# Patient Record
Sex: Male | Born: 1950 | Race: White | Hispanic: No | Marital: Married | Smoking: Never smoker
Health system: Southern US, Community
[De-identification: ages and names within clinical notes are randomized; demographics above are authoritative.]

## PROBLEM LIST (undated history)

## (undated) DIAGNOSIS — R1900 Intra-abdominal and pelvic swelling, mass and lump, unspecified site: Secondary | ICD-10-CM

## (undated) DIAGNOSIS — C61 Malignant neoplasm of prostate: Secondary | ICD-10-CM

## (undated) DIAGNOSIS — T4145XA Adverse effect of unspecified anesthetic, initial encounter: Secondary | ICD-10-CM

## (undated) DIAGNOSIS — T8859XA Other complications of anesthesia, initial encounter: Secondary | ICD-10-CM

## (undated) DIAGNOSIS — R3129 Other microscopic hematuria: Secondary | ICD-10-CM

## (undated) DIAGNOSIS — C4491 Basal cell carcinoma of skin, unspecified: Secondary | ICD-10-CM

## (undated) DIAGNOSIS — IMO0002 Reserved for concepts with insufficient information to code with codable children: Secondary | ICD-10-CM

## (undated) DIAGNOSIS — J309 Allergic rhinitis, unspecified: Secondary | ICD-10-CM

## (undated) HISTORY — DX: Other microscopic hematuria: R31.29

## (undated) HISTORY — PX: TONSILLECTOMY: SHX5217

## (undated) HISTORY — DX: Allergic rhinitis, unspecified: J30.9

## (undated) HISTORY — DX: Reserved for concepts with insufficient information to code with codable children: IMO0002

## (undated) HISTORY — PX: PROSTATE BIOPSY: SHX241

## (undated) HISTORY — DX: Intra-abdominal and pelvic swelling, mass and lump, unspecified site: R19.00

## (undated) HISTORY — PX: TONSILLECTOMY: SUR1361

## (undated) HISTORY — PX: KNEE ARTHROSCOPY W/ ACL RECONSTRUCTION: SHX1858

---

## 2007-12-17 ENCOUNTER — Encounter: Payer: Self-pay | Admitting: Family Medicine

## 2008-05-09 ENCOUNTER — Ambulatory Visit: Payer: Self-pay | Admitting: Family Medicine

## 2008-05-09 DIAGNOSIS — R1310 Dysphagia, unspecified: Secondary | ICD-10-CM | POA: Insufficient documentation

## 2008-05-09 DIAGNOSIS — J309 Allergic rhinitis, unspecified: Secondary | ICD-10-CM

## 2008-05-09 DIAGNOSIS — R3129 Other microscopic hematuria: Secondary | ICD-10-CM

## 2008-05-09 HISTORY — DX: Other microscopic hematuria: R31.29

## 2008-05-09 HISTORY — DX: Allergic rhinitis, unspecified: J30.9

## 2008-05-09 LAB — CONVERTED CEMR LAB
Bilirubin Urine: NEGATIVE
Blood in Urine, dipstick: NEGATIVE
Glucose, Urine, Semiquant: NEGATIVE
Ketones, urine, test strip: NEGATIVE
Specific Gravity, Urine: <1.005
Urobilinogen, UA: 0.2

## 2008-06-08 ENCOUNTER — Encounter: Payer: Self-pay | Admitting: Family Medicine

## 2008-07-21 ENCOUNTER — Ambulatory Visit: Payer: Self-pay | Admitting: Family Medicine

## 2008-07-21 DIAGNOSIS — R1900 Intra-abdominal and pelvic swelling, mass and lump, unspecified site: Secondary | ICD-10-CM

## 2008-07-21 HISTORY — DX: Intra-abdominal and pelvic swelling, mass and lump, unspecified site: R19.00

## 2008-09-21 ENCOUNTER — Encounter: Payer: Self-pay | Admitting: Family Medicine

## 2010-08-16 ENCOUNTER — Other Ambulatory Visit (INDEPENDENT_AMBULATORY_CARE_PROVIDER_SITE_OTHER): Payer: BC Managed Care – PPO

## 2010-08-16 DIAGNOSIS — Z0000 Encounter for general adult medical examination without abnormal findings: Secondary | ICD-10-CM

## 2010-08-16 LAB — LIPID PANEL
Total CHOL/HDL Ratio: 3
VLDL: 11 mg/dL (ref 0.0–40.0)

## 2010-08-16 LAB — CBC WITH DIFFERENTIAL/PLATELET
Basophils Relative: 0.7 % (ref 0.0–3.0)
Eosinophils Relative: 1.9 % (ref 0.0–5.0)
HCT: 39.4 % (ref 39.0–52.0)
Hemoglobin: 13.3 g/dL (ref 13.0–17.0)
MCV: 97.1 fl (ref 78.0–100.0)
Monocytes Absolute: 0.4 10*3/uL (ref 0.1–1.0)
Neutro Abs: 1.8 10*3/uL (ref 1.4–7.7)
Neutrophils Relative %: 45.3 % (ref 43.0–77.0)
RBC: 4.05 Mil/uL — ABNORMAL LOW (ref 4.22–5.81)
WBC: 4.1 10*3/uL — ABNORMAL LOW (ref 4.5–10.5)

## 2010-08-16 LAB — HEPATIC FUNCTION PANEL
Alkaline Phosphatase: 49 U/L (ref 39–117)
Bilirubin, Direct: 0.3 mg/dL (ref 0.0–0.3)
Total Protein: 6.4 g/dL (ref 6.0–8.3)

## 2010-08-16 LAB — POCT URINALYSIS DIPSTICK
Bilirubin, UA: NEGATIVE
Leukocytes, UA: NEGATIVE
Nitrite, UA: NEGATIVE
Urobilinogen, UA: 0.2
pH, UA: 8.5

## 2010-08-16 LAB — BASIC METABOLIC PANEL
BUN: 24 mg/dL — ABNORMAL HIGH (ref 6–23)
GFR: 85.06 mL/min (ref >60.00)
Glucose, Bld: 97 mg/dL (ref 70–99)
Potassium: 4.3 mEq/L (ref 3.5–5.1)

## 2010-09-03 ENCOUNTER — Encounter: Payer: Self-pay | Admitting: Family Medicine

## 2010-09-04 ENCOUNTER — Ambulatory Visit (INDEPENDENT_AMBULATORY_CARE_PROVIDER_SITE_OTHER): Payer: BC Managed Care – PPO | Admitting: Family Medicine

## 2010-09-04 ENCOUNTER — Encounter: Payer: Self-pay | Admitting: Family Medicine

## 2010-09-04 VITALS — BP 110/72 | HR 60 | Temp 98.0°F | Resp 12 | Ht 70.0 in | Wt 142.0 lb

## 2010-09-04 DIAGNOSIS — Z0000 Encounter for general adult medical examination without abnormal findings: Secondary | ICD-10-CM

## 2010-09-04 DIAGNOSIS — R0989 Other specified symptoms and signs involving the circulatory and respiratory systems: Secondary | ICD-10-CM

## 2010-09-04 MED ORDER — TETANUS-DIPHTH-ACELL PERTUSSIS 5-2.5-18.5 LF-MCG/0.5 IM SUSP: 0.5000 mL | Freq: Once | INTRAMUSCULAR | Status: DC

## 2010-09-04 NOTE — Progress Notes (Signed)
  Subjective:    Patient ID: Chris Stone, male    DOB: Jun 07, 1950, 60 y.o.   MRN: [id number]  HPI Here for complete physical exam. Generally very healthy. Exercises regularly. Recent examination aviation exam and carotid bruits noted. He is nonsmoker. No history of CAD or peripheral vascular disease. Note no history of diabetes. Cycles several times per week. No history of hyperlipidemia. No family history of CAD. Feels well. No neurologic symptoms. No history of TIA.  Colonoscopy 2009. Last tetanus unknown.  Past Medical History  Diagnosis Date  . ABDOMINAL MASS 07/21/2008  . ALLERGIC RHINITIS 05/09/2008  . DYSPHAGIA UNSPECIFIED 05/09/2008  . Microscopic hematuria 05/09/2008  . Squamous cell carcinoma     nose   Past Surgical History  Procedure Date  . Tonsillectomy   . Knee arthroscopy w/ acl reconstruction     reports that he has never smoked. He does not have any smokeless tobacco history on file. He reports that he does not drink alcohol or use illicit drugs. family history includes Cancer (age of onset:75) in his father and Hypertension in his father.  There is no history of Arthritis. Allergies  Allergen Reactions  . Penicillins     As a child, rash      Review of Systems  Constitutional: Negative for fever, activity change, appetite change and fatigue.  HENT: Negative for ear pain, congestion and trouble swallowing.   Eyes: Negative for pain and visual disturbance.  Respiratory: Negative for cough, shortness of breath and wheezing.   Cardiovascular: Negative for chest pain and palpitations.  Gastrointestinal: Negative for nausea, vomiting, abdominal pain, diarrhea, constipation, blood in stool, abdominal distention and rectal pain.  Genitourinary: Negative for dysuria, hematuria and testicular pain.  Musculoskeletal: Negative for joint swelling and arthralgias.  Skin: Negative for rash.  Neurological: Negative for dizziness, syncope and headaches.  Hematological:  Negative for adenopathy.  Psychiatric/Behavioral: Negative for confusion and dysphoric mood.       Objective:   Physical Exam  Constitutional: He is oriented to person, place, and time. He appears well-developed and well-nourished. No distress.  HENT:  Head: Normocephalic and atraumatic.  Right Ear: External ear normal.  Left Ear: External ear normal.  Mouth/Throat: Oropharynx is clear and moist.  Eyes: Conjunctivae and EOM are normal. Pupils are equal, round, and reactive to light.  Neck: Normal range of motion. Neck supple. No thyromegaly present.  Cardiovascular: Normal rate, regular rhythm and normal heart sounds.   No murmur heard. Pulmonary/Chest: No respiratory distress. He has no wheezes. He has no rales.  Abdominal: Soft. Bowel sounds are normal. He exhibits no distension and no mass. There is no tenderness. There is no rebound and no guarding.  Genitourinary: Rectum normal and prostate normal.  Musculoskeletal: He exhibits no edema.  Lymphadenopathy:    He has no cervical adenopathy.  Neurological: He is alert and oriented to person, place, and time. He displays normal reflexes. No cranial nerve deficit.  Skin: No rash noted.  Psychiatric: He has a normal mood and affect.          Assessment & Plan:  Healthy 60 year old male. Tetanus booster. Labs reviewed with patient -all favorable. He has question of carotid bruit right and possibly faint on the left. Question if some of this is related to hyperdynamic heart beat. No heart murmur. Go ahead with carotid Dopplers

## 2010-09-13 ENCOUNTER — Encounter (INDEPENDENT_AMBULATORY_CARE_PROVIDER_SITE_OTHER): Payer: BC Managed Care – PPO | Admitting: Cardiology

## 2010-09-13 DIAGNOSIS — R0989 Other specified symptoms and signs involving the circulatory and respiratory systems: Secondary | ICD-10-CM

## 2010-09-19 NOTE — Progress Notes (Signed)
Quick Note:  Pt informed on cell VM ______ 

## 2010-10-03 ENCOUNTER — Ambulatory Visit: Payer: BC Managed Care – PPO | Admitting: Family Medicine

## 2012-12-07 ENCOUNTER — Encounter: Payer: Self-pay | Admitting: Family Medicine

## 2012-12-07 ENCOUNTER — Ambulatory Visit (INDEPENDENT_AMBULATORY_CARE_PROVIDER_SITE_OTHER): Payer: BC Managed Care – PPO | Admitting: Family Medicine

## 2012-12-07 VITALS — BP 110/64 | HR 84 | Temp 98.2°F | Wt 149.0 lb

## 2012-12-07 DIAGNOSIS — R350 Frequency of micturition: Secondary | ICD-10-CM

## 2012-12-07 DIAGNOSIS — R509 Fever, unspecified: Secondary | ICD-10-CM

## 2012-12-07 LAB — POCT URINALYSIS DIPSTICK
Ketones, UA: NEGATIVE
Protein, UA: NEGATIVE
Spec Grav, UA: 1.015
pH, UA: 6.5

## 2012-12-07 MED ORDER — CIPROFLOXACIN HCL 500 MG PO TABS: 500.0000 mg | ORAL_TABLET | Freq: Two times a day (BID) | ORAL | Status: DC

## 2012-12-07 NOTE — Progress Notes (Signed)
Pre visit review using our clinic review tool, if applicable. No additional management support is needed unless otherwise documented below in the visit note. 

## 2012-12-07 NOTE — Progress Notes (Signed)
  Subjective:    Patient ID: Chris Stone, male    DOB: 1951-01-09, 62 y.o.   MRN: [id number]  HPI Patient seen with chief complaint of fever He was with work in [place] last Thursday and noticed some chills but no documented fever. He denied any other symptoms at that time. He returned back here Friday and developed fever 102 along with some chills and body aches. He denied any nasal congestion, cough, sore throat. Only minimal headaches. He has follicular type rash on his back which has had many times in the past. He complained of some mild urinary frequency and urgency but no burning with urination. Denied any abdominal pain. Patient's been taking alternate aspirin Motrin which seems to bring his fever down. Fever 101 earlier today but none currently. Overall feels fairly well  He is not having any significant arthralgias. No recent appetite or weight changes.  Past Medical History  Diagnosis Date  . ABDOMINAL MASS 07/21/2008  . ALLERGIC RHINITIS 05/09/2008  . DYSPHAGIA UNSPECIFIED 05/09/2008  . Microscopic hematuria 05/09/2008  . Squamous cell carcinoma     nose   Past Surgical History  Procedure Laterality Date  . Tonsillectomy    . Knee arthroscopy w/ acl reconstruction      reports that he has never smoked. He does not have any smokeless tobacco history on file. He reports that he does not drink alcohol or use illicit drugs. family history includes Cancer (age of onset: 18) in his father; Hypertension in his father. There is no history of Arthritis. Allergies  Allergen Reactions  . Penicillins     As a child, rash      Review of Systems  Constitutional: Positive for fever and chills. Negative for appetite change and unexpected weight change.  HENT: Negative for ear pain, sore throat, trouble swallowing and voice change.   Respiratory: Negative for cough and shortness of breath.   Cardiovascular: Negative for chest pain.  Gastrointestinal: Negative for abdominal pain.   Genitourinary: Positive for urgency. Negative for hematuria.  Skin: Positive for rash.  Neurological: Positive for headaches (Mild and intermittent). Negative for dizziness.  Hematological: Negative for adenopathy.       Objective:   Physical Exam  Constitutional: He appears well-developed and well-nourished. No distress.  HENT:  Right Ear: External ear normal.  Left Ear: External ear normal.  Mouth/Throat: Oropharynx is clear and moist.  Neck: Neck supple.  Cardiovascular: Normal rate.   Pulmonary/Chest: Effort normal and breath sounds normal. No respiratory distress. He has no wheezes. He has no rales.  Abdominal: Soft. He exhibits no mass. There is no tenderness. There is no rebound and no guarding.  Genitourinary: Rectum normal and prostate normal.  Musculoskeletal: He exhibits no edema.  Lymphadenopathy:    He has no cervical adenopathy.  Neurological: He is alert.  Skin: Rash noted.  Patient has follicular rash scattered on his back right and left side. No petechiae. No pustules.          Assessment & Plan:  Fever. Patient looks well and has nonfocal exam. His urine dipstick is equivocal and relatively low suspicion for UTI with trace blood and trace leukocytes. Urine culture sent. Start Cipro 500 mg twice a day pending culture results. Check CBC and sedimentation rate. He does not have any respiratory symptoms. He has nonspecific follicular rash which doubt is related to his fever and he had this multiple times in the past without fever

## 2012-12-08 LAB — CBC WITH DIFFERENTIAL/PLATELET
Basophils Absolute: 0 10*3/uL (ref 0.0–0.1)
Eosinophils Absolute: 0 10*3/uL (ref 0.0–0.7)
HCT: 36.2 % — ABNORMAL LOW (ref 39.0–52.0)
Lymphocytes Relative: 18.5 % (ref 12.0–46.0)
MCHC: 35.2 g/dL (ref 30.0–36.0)
Monocytes Absolute: 0.5 10*3/uL (ref 0.1–1.0)
Monocytes Relative: 11.9 % (ref 3.0–12.0)
Neutrophils Relative %: 68.3 % (ref 43.0–77.0)
Platelets: 266 10*3/uL (ref 150.0–400.0)
RDW: 12.4 % (ref 11.5–14.6)

## 2012-12-08 LAB — SEDIMENTATION RATE: Sed Rate: 60 mm/hr — ABNORMAL HIGH (ref 0–22)

## 2012-12-09 ENCOUNTER — Telehealth: Payer: Self-pay | Admitting: Family Medicine

## 2012-12-09 LAB — URINE CULTURE

## 2012-12-09 NOTE — Telephone Encounter (Signed)
Pt states dr Caryl Never told him to report back if his fever came back and it was 99 last night. W/ headache at night. Goes away during the day. Pt would like to speak w/ dr burchette to rule out lime disease at they discussed.

## 2012-12-09 NOTE — Telephone Encounter (Signed)
Pt would like it if you would give him a call back.

## 2012-12-14 ENCOUNTER — Ambulatory Visit (INDEPENDENT_AMBULATORY_CARE_PROVIDER_SITE_OTHER): Payer: BC Managed Care – PPO | Admitting: Family Medicine

## 2012-12-14 ENCOUNTER — Encounter: Payer: Self-pay | Admitting: Family Medicine

## 2012-12-14 ENCOUNTER — Telehealth: Payer: Self-pay | Admitting: Family Medicine

## 2012-12-14 VITALS — BP 110/68 | HR 82 | Temp 98.0°F | Wt 145.0 lb

## 2012-12-14 DIAGNOSIS — N390 Urinary tract infection, site not specified: Secondary | ICD-10-CM

## 2012-12-14 DIAGNOSIS — M2550 Pain in unspecified joint: Secondary | ICD-10-CM

## 2012-12-14 DIAGNOSIS — D649 Anemia, unspecified: Secondary | ICD-10-CM

## 2012-12-14 DIAGNOSIS — T148 Other injury of unspecified body region: Secondary | ICD-10-CM

## 2012-12-14 DIAGNOSIS — W57XXXA Bitten or stung by nonvenomous insect and other nonvenomous arthropods, initial encounter: Secondary | ICD-10-CM

## 2012-12-14 LAB — POCT URINALYSIS DIPSTICK
Leukocytes, UA: NEGATIVE
Nitrite, UA: NEGATIVE
Protein, UA: NEGATIVE
Spec Grav, UA: >=1.03
Urobilinogen, UA: 0.2
pH, UA: 5.5

## 2012-12-14 NOTE — Progress Notes (Signed)
   Subjective:    Patient ID: Donald Prose, male    DOB: 11/16/1950, 62 y.o.   MRN: [id number]  HPI Patient presents for followup regarding recent febrile illness. His symptoms and presentation were fairly nonspecific. He complained of some urine frequency but no burning. Urine dipstick revealed trace leukocytes and trace blood. His urine culture did grow out greater than 100,000 colonies Escherichia coli sensitive to Cipro which he started on. He has not had any documented fevers since then.  He had nonspecific elevation of sedimentation rate of 60 and CBC with mild normocytic anemia with hemoglobin 12.7. Patient relates previous colonoscopy about 3 years ago.  Overall, feels fairly well at this time but has had some complaints of intermittent arthralgias of the past couple of months. He relates back in August pulled off couple of deer ticks when he was up in [place]. He never had any rash. No fever until recently.  Past Medical History  Diagnosis Date  . ABDOMINAL MASS 07/21/2008  . ALLERGIC RHINITIS 05/09/2008  . DYSPHAGIA UNSPECIFIED 05/09/2008  . Microscopic hematuria 05/09/2008  . Squamous cell carcinoma     nose   Past Surgical History  Procedure Laterality Date  . Tonsillectomy    . Knee arthroscopy w/ acl reconstruction      reports that he has never smoked. He does not have any smokeless tobacco history on file. He reports that he does not drink alcohol or use illicit drugs. family history includes Cancer (age of onset: 71) in his father; Hypertension in his father. There is no history of Arthritis. Allergies  Allergen Reactions  . Penicillins     As a child, rash      Review of Systems  Constitutional: Negative for fever, chills and unexpected weight change.  Respiratory: Negative for cough and shortness of breath.   Cardiovascular: Negative for chest pain.  Gastrointestinal: Negative for abdominal pain.  Musculoskeletal: Positive for arthralgias. Negative  for myalgias.  Skin: Negative for rash.  Neurological: Negative for dizziness and headaches.  Hematological: Negative for adenopathy.       Objective:   Physical Exam  Constitutional: He appears well-developed and well-nourished.  HENT:  Mouth/Throat: Oropharynx is clear and moist.  Neck: Neck supple.  Cardiovascular: Normal rate and regular rhythm.   Pulmonary/Chest: Effort normal and breath sounds normal. No respiratory distress. He has no wheezes. He has no rales.  Musculoskeletal: He exhibits no edema.  Lymphadenopathy:    He has no cervical adenopathy.  Skin: No rash noted.          Assessment & Plan:  #1 recent fever.  We initially entertained idea that his fever might not have been infectious related since he did not have any obvious symptoms. However, with positive urine culture that likely explains his recent fever and his fever did seem to have resolve since treatment for that.  Repeat urinalysis today #2 recent normocytic anemia. Very nonspecific. Recent colonoscopy a few years ago normal. Check further labs with TIBC, serum ferritin, serum iron, B12, repeat CBC. #3 history of deer tick bites with intermittent relatively mild arthralgias. Check Lyme antibodies, though he does not give any history of likely erythema chronicum migrans rash. #4 arthralgias. Doubt PMR but he does have some mild stiffness- but symptoms tend to be more intermittent.

## 2012-12-14 NOTE — Progress Notes (Signed)
Pre visit review using our clinic review tool, if applicable. No additional management support is needed unless otherwise documented below in the visit note. 

## 2012-12-14 NOTE — Telephone Encounter (Signed)
Patient Information:  Caller Name: Yishai  Phone: [phone]  Patient: Chris Stone, Chris Stone  Gender: Male  DOB: 05/16/1950  Age: 62 Years  PCP: Evelena Peat [facility])  Office Follow Up:  Does the office need to follow up with this patient?: No  Instructions For The Office: N/A  RN Note:  On day 6 of Cipro for UTI.  Concerned about elevated temperature, 99.0 12/13/12 PM. Reports chronic low back pain for months. Mild left flank pain present. Dr Caryl Never has no appointments within 4 hours. Patient declined to see another provider sooner.  Requested appointment at 16:00  Symptoms  Reason For Call & Symptoms: Concerned about "fever" (temp elevation) of 99.0 po 12/13/12 at 2000.   On day #6 of 7 day course of Cipro for UTI.  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  Reviewed Health History In EMR: Yes  Reviewed Medications In EMR: Yes  Reviewed Allergies In EMR: Yes  Reviewed Surgeries / Procedures: Yes  Date of Onset of Symptoms: 12/04/2012  Treatments Tried: Cipro since 12/09/12  Treatments Tried Worked: No  Guideline(s) Used:  Urination Pain - Male  Disposition Per Guideline:   Go to Office Now  Reason For Disposition Reached:   Side (flank) or lower back pain present  Advice Given:  Fluids  : Drink extra fluids (Reason: to produce a dilute, nonirritating urine).  Call Back If:   Fever lasts more than 24 hours on antibiotics  You become worse.  Patient Will Follow Care Advice:  YES  Appointment Scheduled:  12/14/2012 16:00:00 Appointment Scheduled Provider:  Evelena Peat [facility])

## 2012-12-15 LAB — IRON AND TIBC
%SAT: 15 % — ABNORMAL LOW (ref 20–55)
Iron: 49 ug/dL (ref 42–165)
UIBC: 269 ug/dL (ref 125–400)

## 2012-12-15 LAB — CBC WITH DIFFERENTIAL/PLATELET
Basophils Relative: 0.4 % (ref 0.0–3.0)
Eosinophils Absolute: 0 10*3/uL (ref 0.0–0.7)
Eosinophils Relative: 0.7 % (ref 0.0–5.0)
Hemoglobin: 13.5 g/dL (ref 13.0–17.0)
Lymphocytes Relative: 24.6 % (ref 12.0–46.0)
MCV: 93.8 fl (ref 78.0–100.0)
Monocytes Absolute: 0.4 10*3/uL (ref 0.1–1.0)
Neutrophils Relative %: 66.8 % (ref 43.0–77.0)
Platelets: 416 10*3/uL — ABNORMAL HIGH (ref 150.0–400.0)
RBC: 4.18 Mil/uL — ABNORMAL LOW (ref 4.22–5.81)
RDW: 12.2 % (ref 11.5–14.6)
WBC: 5.8 10*3/uL (ref 4.5–10.5)

## 2012-12-15 LAB — B. BURGDORFI ANTIBODIES: B burgdorferi Ab IgG+IgM: 0.3 {ISR}

## 2012-12-15 LAB — SEDIMENTATION RATE: Sed Rate: 17 mm/hr (ref 0–22)

## 2012-12-21 ENCOUNTER — Ambulatory Visit: Payer: BC Managed Care – PPO | Admitting: Family Medicine

## 2013-02-17 ENCOUNTER — Telehealth: Payer: Self-pay | Admitting: Family Medicine

## 2013-02-17 NOTE — Telephone Encounter (Signed)
Patient Information:  Caller Name: Truett  Phone: [phone]  Patient: Chris Stone, Chris Stone  Gender: Male  DOB: Sep 23, 1950  Age: 63 Years  PCP: Carolann Littler (Family Practice)  Office Follow Up:  Does the office need to follow up with this patient?: No  Instructions For The Office: N/A   Symptoms  Reason For Call & Symptoms: Pt has had a couple of episodes of trace blood on toilet paper.  Pt does have hx of hemorrhoids.   02/17/13 small amt of blood on toilet paper, stool doesn't appear red/black/tarry, pt hasn't had any blood in underwear so as far as he knows the bleeding is only occuring with bowel movements.  No abd/rectal pain.  Afebrile.  Reviewed Health History In EMR: Yes  Reviewed Medications In EMR: Yes  Reviewed Allergies In EMR: Yes  Reviewed Surgeries / Procedures: Yes  Date of Onset of Symptoms: 01/14/2013  Guideline(s) Used:  Rectal Bleeding  Disposition Per Guideline:   See Within 2 Weeks in Office  Reason For Disposition Reached:   Rectal bleeding is minimal (e.g., blood just on toilet paper, a few drops in toilet bowl)  Advice Given:  Warm SITZ Baths Twice a Day:   Sit in a warm saline bath for 20 minutes 2 times daily to cleanse the rectal area and to promote healing.  You can add 2 ounces (57 grams) of table salt or baking soda to each tub of water.  Call Back If:  Bleeding increases in amount  You become worse.  Patient Will Follow Care Advice:  YES  Appointment Scheduled:  02/18/2013 09:30:00 Appointment Scheduled Provider:  Carolann Littler [facility])

## 2013-02-17 NOTE — Telephone Encounter (Signed)
Patient has an appt with BB on 02/18/13.

## 2013-02-18 ENCOUNTER — Ambulatory Visit (INDEPENDENT_AMBULATORY_CARE_PROVIDER_SITE_OTHER): Payer: BC Managed Care – PPO | Admitting: Family Medicine

## 2013-02-18 ENCOUNTER — Other Ambulatory Visit: Payer: Self-pay

## 2013-02-18 ENCOUNTER — Encounter: Payer: Self-pay | Admitting: Family Medicine

## 2013-02-18 VITALS — BP 108/68 | HR 112 | Temp 98.3°F | Wt 148.0 lb

## 2013-02-18 DIAGNOSIS — K921 Melena: Secondary | ICD-10-CM

## 2013-02-18 MED ORDER — HYDROCORTISONE ACETATE 25 MG RE SUPP: 25.0000 mg | Freq: Two times a day (BID) | RECTAL | Status: DC

## 2013-02-18 NOTE — Progress Notes (Signed)
Pre visit review using our clinic review tool, if applicable. No additional management support is needed unless otherwise documented below in the visit note. 

## 2013-02-18 NOTE — Progress Notes (Signed)
   Subjective:    Patient ID: Chris Stone, male    DOB: [birth year], 63 y.o.   MRN: [id number]  Rectal Bleeding  Pertinent negatives include no fever, no abdominal pain, no diarrhea, no nausea and no vomiting.   3-4 recent episodes of bright red blood per rectum with wiping. Symptoms are often. Last colonoscopy estimated 2000 and though we do not have records. He's not any change of bowel habits. No constipation. No diarrhea. No appetite or weight changes. No associated pain with bowel movements. Bright blood this occurred only with wiping. Past history of external hemorrhoids. Denies any abdominal pain Recent hgb normal.  Past Medical History  Diagnosis Date  . ABDOMINAL MASS 07/21/2008  . ALLERGIC RHINITIS 05/09/2008  . DYSPHAGIA UNSPECIFIED 05/09/2008  . Microscopic hematuria 05/09/2008  . Squamous cell carcinoma     nose   Past Surgical History  Procedure Laterality Date  . Tonsillectomy    . Knee arthroscopy w/ acl reconstruction      reports that he has never smoked. He does not have any smokeless tobacco history on file. He reports that he does not drink alcohol or use illicit drugs. family history includes Cancer (age of onset: 72) in his father; Hypertension in his father. There is no history of Arthritis. Allergies  Allergen Reactions  . Penicillins     As a child, rash      Review of Systems  Constitutional: Negative for fever, chills, appetite change and unexpected weight change.  Respiratory: Negative for shortness of breath.   Gastrointestinal: Positive for hematochezia and anal bleeding. Negative for nausea, vomiting, abdominal pain, diarrhea, constipation and abdominal distention.  Hematological: Negative for adenopathy.       Objective:   Physical Exam  Constitutional: He appears well-developed and well-nourished.  Cardiovascular: Normal rate.   Pulmonary/Chest: Effort normal and breath sounds normal. No respiratory distress. He has no wheezes. He has no  rales.  Genitourinary:  He has some external skin tags. No anal fissure. He has some internal hemorrhoids which are trying to prolapse out slightly. Digital exam reveals no masses. Hemoccult negative stool          Assessment & Plan:  Hemorrhoids. We discussed measures to reduce constipation. Warm sitz baths. Hydrocortisone 25 mg progress twice daily

## 2013-02-18 NOTE — Patient Instructions (Signed)
Hemorrhoids Hemorrhoids are swollen veins around the rectum or anus. There are two types of hemorrhoids:   Internal hemorrhoids. These occur in the veins just inside the rectum. They may poke through to the outside and become irritated and painful.  External hemorrhoids. These occur in the veins outside the anus and can be felt as a painful swelling or hard lump near the anus. CAUSES  Pregnancy.   Obesity.   Constipation or diarrhea.   Straining to have a bowel movement.   Sitting for long periods on the toilet.  Heavy lifting or other activity that caused you to strain.  Anal intercourse. SYMPTOMS   Pain.   Anal itching or irritation.   Rectal bleeding.   Fecal leakage.   Anal swelling.   One or more lumps around the anus.  DIAGNOSIS  Your caregiver may be able to diagnose hemorrhoids by visual examination. Other examinations or tests that may be performed include:   Examination of the rectal area with a gloved hand (digital rectal exam).   Examination of anal canal using a small tube (scope).   A blood test if you have lost a significant amount of blood.  A test to look inside the colon (sigmoidoscopy or colonoscopy). TREATMENT Most hemorrhoids can be treated at home. However, if symptoms do not seem to be getting better or if you have a lot of rectal bleeding, your caregiver may perform a procedure to help make the hemorrhoids get smaller or remove them completely. Possible treatments include:   Placing a rubber band at the base of the hemorrhoid to cut off the circulation (rubber band ligation).   Injecting a chemical to shrink the hemorrhoid (sclerotherapy).   Using a tool to burn the hemorrhoid (infrared light therapy).   Surgically removing the hemorrhoid (hemorrhoidectomy).   Stapling the hemorrhoid to block blood flow to the tissue (hemorrhoid stapling).  HOME CARE INSTRUCTIONS   Eat foods with fiber, such as whole grains, beans,  nuts, fruits, and vegetables. Ask your doctor about taking products with added fiber in them (fibersupplements).  Increase fluid intake. Drink enough water and fluids to keep your urine clear or pale yellow.   Exercise regularly.   Go to the bathroom when you have the urge to have a bowel movement. Do not wait.   Avoid straining to have bowel movements.   Keep the anal area dry and clean. Use wet toilet paper or moist towelettes after a bowel movement.   Medicated creams and suppositories may be used or applied as directed.   Only take over-the-counter or prescription medicines as directed by your caregiver.   Take warm sitz baths for 15 20 minutes, 3 4 times a day to ease pain and discomfort.   Place ice packs on the hemorrhoids if they are tender and swollen. Using ice packs between sitz baths may be helpful.   Put ice in a plastic bag.   Place a towel between your skin and the bag.   Leave the ice on for 15 20 minutes, 3 4 times a day.   Do not use a donut-shaped pillow or sit on the toilet for long periods. This increases blood pooling and pain.  SEEK MEDICAL CARE IF:  You have increasing pain and swelling that is not controlled by treatment or medicine.  You have uncontrolled bleeding.  You have difficulty or you are unable to have a bowel movement.  You have pain or inflammation outside the area of the hemorrhoids. MAKE SURE YOU:    Understand these instructions.  Will watch your condition.  Will get help right away if you are not doing well or get worse. Document Released: 12/29/1999 Document Revised: 12/18/2011 Document Reviewed: 11/05/2011 Exit[employer] Patient Information 2014 [employer].  

## 2013-03-03 ENCOUNTER — Telehealth: Payer: Self-pay | Admitting: Family Medicine

## 2013-03-03 NOTE — Telephone Encounter (Addendum)
Pt states he has exhausted his short term sick leave and it will go into long term.  Pt has a form needs to be filled out and request to email (sending to [place])  From [email or web address] From an illness time period 11/21-12/10

## 2013-03-05 NOTE — Telephone Encounter (Signed)
Form in your folder

## 2013-03-08 NOTE — Telephone Encounter (Signed)
Pt informed

## 2013-03-08 NOTE — Telephone Encounter (Signed)
Let him know I will have completed before I leave today.

## 2013-05-25 ENCOUNTER — Telehealth: Payer: Self-pay | Admitting: Family Medicine

## 2013-05-25 NOTE — Telephone Encounter (Signed)
Patient Information:  Caller Name: [name]  Phone: [phone]  Patient: Chris Stone, Chris Stone  Gender: Male  DOB: 1950-03-08  Age: 63 Years  PCP: Carolann Littler [facility])  Office Follow Up:  Does the office need to follow up with this patient?: No  Instructions For The Office: N/A  RN Note:  Home care advice and call back parameters reviewed. Appt scheduled.  Symptoms  Reason For Call & Symptoms: Patient states he has had nagging back since Fall 2014.  He reports this occurred after lifing a battery from a boat/building house and lifting.  Located at belt line. Left side /Left flank.  Constant worse with laying in bed or first getting up, worse with bending over to get shoes.  No urinary issues. Afebrile  Reviewed Health History In EMR: Yes  Reviewed Medications In EMR: Yes  Reviewed Allergies In EMR: Yes  Reviewed Surgeries / Procedures: Yes  Date of Onset of Symptoms: 11/14/2012  Treatments Tried: Advil  Treatments Tried Worked: Yes  Guideline(s) Used:  Flank Pain  Disposition Per Guideline:   See Within 3 Days in Office  Reason For Disposition Reached:   Mild pain (i.e., scale 1-3; does not interfere with normal activities) and present > 3 days  Advice Given:  Reassurance:  Mild flank and back pain can result from excessive twisting, heavy lifting, or from an un-noticed minor back injury.  Most times, the pain gets better in a couple days.  Here is some care advice that should help.  Cold or Heat:  Heat Pack: If pain lasts over 2 days, apply heat to the sore area. Use a heat pack, heating pad, or warm wet washcloth. Do this for 10 minutes, then as needed.  Pain Medicines:  For pain relief, you can take either acetaminophen, ibuprofen, or naproxen.  Acetaminophen (e.g., Tylenol):  Regular Strength Tylenol: Take 650 mg (two 325 mg pills) by mouth every 4-6 hours as needed. Each Regular Strength Tylenol pill has 325 mg of acetaminophen.  Ibuprofen (e.g., Motrin,  Advil):  Take 400 mg (two 200 mg pills) by mouth every 6 hours.  Another choice is to take 600 mg (three 200 mg pills) by mouth every 8 hours.  The most you should take each day is 1,200 mg (six 200 mg pills), unless your doctor has told you to take more.  Call Back If:  Fever over 100.5 F (38.1 C)  Burning with urination or blood in urine  You become worse.  Patient Will Follow Care Advice:  YES  Appointment Scheduled:  05/26/2013 14:00:00 Appointment Scheduled Provider:  Alysia Penna [facility])

## 2013-05-26 ENCOUNTER — Ambulatory Visit
Admission: RE | Admit: 2013-05-26 | Discharge: 2013-05-26 | Disposition: A | Discharge status: Home / Self Care | Payer: BC Managed Care – PPO | Source: Ambulatory Visit | Attending: Family Medicine | Admitting: Family Medicine

## 2013-05-26 ENCOUNTER — Encounter: Payer: Self-pay | Admitting: Family Medicine

## 2013-05-26 ENCOUNTER — Ambulatory Visit (INDEPENDENT_AMBULATORY_CARE_PROVIDER_SITE_OTHER): Payer: BC Managed Care – PPO | Admitting: Family Medicine

## 2013-05-26 VITALS — BP 114/67 | HR 74 | Temp 98.4°F | Ht 70.0 in | Wt 142.0 lb

## 2013-05-26 DIAGNOSIS — M545 Low back pain, unspecified: Secondary | ICD-10-CM

## 2013-05-26 MED ORDER — DICLOFENAC SODIUM 75 MG PO TBEC: 75.0000 mg | DELAYED_RELEASE_TABLET | Freq: Two times a day (BID) | ORAL | Status: DC

## 2013-05-26 MED ORDER — CYCLOBENZAPRINE HCL 10 MG PO TABS: 10.0000 mg | ORAL_TABLET | Freq: Three times a day (TID) | ORAL | Status: DC | PRN

## 2013-05-26 NOTE — Progress Notes (Signed)
Pre visit review using our clinic review tool, if applicable. No additional management support is needed unless otherwise documented below in the visit note. 

## 2013-05-26 NOTE — Progress Notes (Signed)
   Subjective:    Patient ID: Chris Stone, male    DOB: 1950/09/15, 63 y.o.   MRN: [id number]  HPI Here for 7 months of dull achy pain in the left lower back which started after he lifted a battery out of his boat and carried it up a hill to his house. He is using Advil prn. The pain does not radiate down the leg. 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    Review of Systems  Constitutional: Negative.   Gastrointestinal: Negative.   Genitourinary: Negative.   Musculoskeletal: Positive for back pain.       Objective:   Physical Exam  Constitutional: He appears well-developed and well-nourished.  Musculoskeletal: Normal range of motion. He exhibits no edema and no tenderness.  Exam of his back is normal           Assessment & Plan:  We will get an Xray of the LS spine today. We discussed doing stretches and core exercises. Use heat prn. Try Flexeril and Diclofenac.

## 2014-04-26 ENCOUNTER — Other Ambulatory Visit (INDEPENDENT_AMBULATORY_CARE_PROVIDER_SITE_OTHER): Payer: BLUE CROSS/BLUE SHIELD

## 2014-04-26 DIAGNOSIS — Z0000 Encounter for general adult medical examination without abnormal findings: Secondary | ICD-10-CM

## 2014-04-26 LAB — CBC WITH DIFFERENTIAL/PLATELET
BASOS PCT: 0.5 % (ref 0.0–3.0)
Basophils Absolute: 0 10*3/uL (ref 0.0–0.1)
EOS PCT: 1.5 % (ref 0.0–5.0)
Eosinophils Absolute: 0.1 10*3/uL (ref 0.0–0.7)
HCT: 42.1 % (ref 39.0–52.0)
HEMOGLOBIN: 14.6 g/dL (ref 13.0–17.0)
LYMPHS PCT: 26.2 % (ref 12.0–46.0)
Lymphs Abs: 1.7 10*3/uL (ref 0.7–4.0)
MCHC: 34.7 g/dL (ref 30.0–36.0)
MCV: 94.1 fl (ref 78.0–100.0)
MONOS PCT: 12.9 % — AB (ref 3.0–12.0)
Monocytes Absolute: 0.8 10*3/uL (ref 0.1–1.0)
NEUTROS ABS: 3.7 10*3/uL (ref 1.4–7.7)
Neutrophils Relative %: 58.9 % (ref 43.0–77.0)
Platelets: 251 10*3/uL (ref 150.0–400.0)
RBC: 4.48 Mil/uL (ref 4.22–5.81)
RDW: 13.1 % (ref 11.5–15.5)
WBC: 6.4 10*3/uL (ref 4.0–10.5)

## 2014-04-26 LAB — LIPID PANEL
CHOLESTEROL: 205 mg/dL — AB (ref 0–200)
HDL: 64.6 mg/dL (ref >39.00)
LDL Cholesterol: 124 mg/dL — ABNORMAL HIGH (ref 0–99)
NONHDL: 140.4
Total CHOL/HDL Ratio: 3
Triglycerides: 82 mg/dL (ref 0.0–149.0)
VLDL: 16.4 mg/dL (ref 0.0–40.0)

## 2014-04-26 LAB — BASIC METABOLIC PANEL
BUN: 16 mg/dL (ref 6–23)
CHLORIDE: 105 meq/L (ref 96–112)
CO2: 26 mEq/L (ref 19–32)
Calcium: 9.1 mg/dL (ref 8.4–10.5)
Creatinine, Ser: 1 mg/dL (ref 0.40–1.50)
GFR: 80.16 mL/min (ref >60.00)
Glucose, Bld: 96 mg/dL (ref 70–99)
POTASSIUM: 4.2 meq/L (ref 3.5–5.1)
Sodium: 137 mEq/L (ref 135–145)

## 2014-04-26 LAB — HEPATIC FUNCTION PANEL
ALK PHOS: 62 U/L (ref 39–117)
ALT: 22 U/L (ref 0–53)
AST: 30 U/L (ref 0–37)
Albumin: 4 g/dL (ref 3.5–5.2)
BILIRUBIN TOTAL: 1.3 mg/dL — AB (ref 0.2–1.2)
Bilirubin, Direct: 0.2 mg/dL (ref 0.0–0.3)
Total Protein: 6.6 g/dL (ref 6.0–8.3)

## 2014-04-26 LAB — PSA: PSA: 3.04 ng/mL (ref 0.10–4.00)

## 2014-04-26 LAB — TSH: TSH: 1.7 u[IU]/mL (ref 0.35–4.50)

## 2014-05-03 ENCOUNTER — Encounter: Payer: Self-pay | Admitting: Family Medicine

## 2014-05-03 ENCOUNTER — Ambulatory Visit (INDEPENDENT_AMBULATORY_CARE_PROVIDER_SITE_OTHER): Payer: BLUE CROSS/BLUE SHIELD | Admitting: Family Medicine

## 2014-05-03 VITALS — BP 124/82 | HR 63 | Temp 98.0°F | Ht 69.5 in | Wt 146.0 lb

## 2014-05-03 DIAGNOSIS — R972 Elevated prostate specific antigen [PSA]: Secondary | ICD-10-CM | POA: Diagnosis not present

## 2014-05-03 DIAGNOSIS — Z0000 Encounter for general adult medical examination without abnormal findings: Secondary | ICD-10-CM | POA: Diagnosis not present

## 2014-05-03 NOTE — Progress Notes (Signed)
   Subjective:    Patient ID: Chris Stone, male    DOB: [birth year], 64 y.o.   MRN: [id number]  HPI Patient here for complete physical. Generally very healthy. 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 Colonoscopy 2009. He has remote history of squamous cell skin cancer. He sees a Paediatric nurse yearly. No history of shingles vaccine. Tetanus is up-to-date. Father had prostate cancer. Patient requesting repeat EKG. Nonspecific T-wave changes apparently had previous EKG earlier this year from Risk analyst.  Past Medical History  Diagnosis Date  . ABDOMINAL MASS 07/21/2008  . ALLERGIC RHINITIS 05/09/2008  . DYSPHAGIA UNSPECIFIED 05/09/2008  . Microscopic hematuria 05/09/2008  . Squamous cell carcinoma     nose   Past Surgical History  Procedure Laterality Date  . Tonsillectomy    . Knee arthroscopy w/ acl reconstruction      reports that he has never smoked. He has never used smokeless tobacco. He reports that he drinks alcohol. He reports that he does not use illicit drugs. family history includes Cancer (age of onset: 28) in his father; Hypertension in his father; Stroke in his father. There is no history of Arthritis. Allergies  Allergen Reactions  . Penicillins     As a child, rash      Review of Systems  Constitutional: Negative for fever, activity change, appetite change and fatigue.  HENT: Negative for congestion, ear pain and trouble swallowing.   Eyes: Negative for pain and visual disturbance.  Respiratory: Negative for cough, shortness of breath and wheezing.   Cardiovascular: Negative for chest pain and palpitations.  Gastrointestinal: Negative for nausea, vomiting, abdominal pain, diarrhea, constipation, blood in stool, abdominal distention and rectal pain.  Genitourinary: Negative for dysuria, hematuria and testicular pain.  Musculoskeletal: Negative for joint swelling and arthralgias.  Skin: Negative for rash.    Neurological: Negative for dizziness, syncope and headaches.  Hematological: Negative for adenopathy.  Psychiatric/Behavioral: Negative for confusion and dysphoric mood.       Objective:   Physical Exam  Constitutional: He is oriented to person, place, and time. He appears well-developed and well-nourished. No distress.  HENT:  Head: Normocephalic and atraumatic.  Right Ear: External ear normal.  Left Ear: External ear normal.  Mouth/Throat: Oropharynx is clear and moist.  Eyes: Conjunctivae and EOM are normal. Pupils are equal, round, and reactive to light.  Neck: Normal range of motion. Neck supple. No thyromegaly present.  Cardiovascular: Normal rate, regular rhythm and normal heart sounds.   No murmur heard. Pulmonary/Chest: No respiratory distress. He has no wheezes. He has no rales.  Abdominal: Soft. Bowel sounds are normal. He exhibits no distension and no mass. There is no tenderness. There is no rebound and no guarding.  Genitourinary: Rectum normal and prostate normal.  Musculoskeletal: He exhibits no edema.  Lymphadenopathy:    He has no cervical adenopathy.  Neurological: He is alert and oriented to person, place, and time. He displays normal reflexes. No cranial nerve deficit.  Skin: No rash noted.  Psychiatric: He has a normal mood and affect.          Assessment & Plan:  Complete physical. Lab work reviewed. Mild increase in PSA. Prostate exam unremarkable. Repeat PSA 6 months. Obtain EKG. Continue regular exercise habits. Check on insurance coverage for shingles vaccine. EKG shows sinus rhythm. He has some nonspecific repolarization changes inferior leads not significantly changed from prior tracing

## 2014-05-03 NOTE — Progress Notes (Signed)
Pre visit review using our clinic review tool, if applicable. No additional management support is needed unless otherwise documented below in the visit note. 

## 2014-05-03 NOTE — Patient Instructions (Signed)
Consider shingles vaccine and let's repeat PSA in about 6 months.

## 2014-11-02 ENCOUNTER — Other Ambulatory Visit: Payer: BLUE CROSS/BLUE SHIELD

## 2014-11-18 ENCOUNTER — Other Ambulatory Visit (INDEPENDENT_AMBULATORY_CARE_PROVIDER_SITE_OTHER): Payer: BLUE CROSS/BLUE SHIELD

## 2014-11-18 ENCOUNTER — Ambulatory Visit (INDEPENDENT_AMBULATORY_CARE_PROVIDER_SITE_OTHER): Payer: BLUE CROSS/BLUE SHIELD | Admitting: Family Medicine

## 2014-11-18 ENCOUNTER — Encounter: Payer: Self-pay | Admitting: Family Medicine

## 2014-11-18 ENCOUNTER — Ambulatory Visit (INDEPENDENT_AMBULATORY_CARE_PROVIDER_SITE_OTHER)
Admission: RE | Admit: 2014-11-18 | Discharge: 2014-11-18 | Disposition: A | Discharge status: Home / Self Care | Payer: BLUE CROSS/BLUE SHIELD | Source: Ambulatory Visit | Attending: Family Medicine | Admitting: Family Medicine

## 2014-11-18 VITALS — BP 100/64 | HR 76 | Temp 98.2°F | Ht 69.5 in | Wt 148.0 lb

## 2014-11-18 DIAGNOSIS — R972 Elevated prostate specific antigen [PSA]: Secondary | ICD-10-CM

## 2014-11-18 DIAGNOSIS — R519 Headache, unspecified: Secondary | ICD-10-CM

## 2014-11-18 DIAGNOSIS — R51 Headache: Secondary | ICD-10-CM

## 2014-11-18 LAB — PSA: PSA: 3.66 ng/mL (ref 0.10–4.00)

## 2014-11-18 NOTE — Progress Notes (Signed)
   Subjective:    Patient ID: Chris Stone, male    DOB: 11-30-1950, 64 y.o.   MRN: [id number]  HPI Here for a right sided occipital headache that started 4 weeks ago and which will not go away. The first day he took a few aspirins which helped, but he has taken nothing for this since then. The HA is mild but persistent. No dizziness, no blurred vision, no other symptoms at all. He had a normal eye exam last week. He typically does not get HAs. No recent trauma.    Review of Systems  Constitutional: Negative.   Eyes: Negative.   Respiratory: Negative.   Cardiovascular: Negative.   Neurological: Positive for headaches. Negative for dizziness, tremors, seizures, syncope, facial asymmetry, speech difficulty, weakness, light-headedness and numbness.       Objective:   Physical Exam  Constitutional: He is oriented to person, place, and time. He appears well-developed and well-nourished. No distress.  HENT:  Head: Normocephalic and atraumatic.  Right Ear: External ear normal.  Left Ear: External ear normal.  Nose: Nose normal.  Mouth/Throat: Oropharynx is clear and moist.  No scalp tenderness   Eyes: Conjunctivae and EOM are normal. Pupils are equal, round, and reactive to light.  Neck: Neck supple. No thyromegaly present.  Cardiovascular: Normal rate, regular rhythm, normal heart sounds and intact distal pulses.   Pulmonary/Chest: Effort normal and breath sounds normal.  Lymphadenopathy:    He has no cervical adenopathy.  Neurological: He is alert and oriented to person, place, and time. He has normal reflexes. No cranial nerve deficit. He exhibits normal muscle tone. Coordination normal.          Assessment & Plan:  Persistent headache. To rule out any structural lesions we will set up a head CT for today. Use Ibuprofen prn.

## 2014-11-18 NOTE — Progress Notes (Signed)
Pre visit review using our clinic review tool, if applicable. No additional management support is needed unless otherwise documented below in the visit note. 

## 2015-04-23 ENCOUNTER — Telehealth: Payer: Self-pay | Admitting: Internal Medicine

## 2015-04-23 NOTE — Telephone Encounter (Signed)
PC with patient Had been mountain biking today After fairly peak exertion, he noted skipped beats for a while. First when he had stopped to rest---- describes trigeminy, then quadregeminy.  Then rode at more leisurely pace  Felt good on the bike No chest pain No dizziness  No SOB   Discussed that this doesn't seem like ischemic type symptoms--but is concerning since it happened with rapid heart beat Needs eval soon--but not today Should limit aerobic exercise until has evaluation (probably needs echo, at least)

## 2015-04-24 ENCOUNTER — Telehealth: Payer: Self-pay | Admitting: Family Medicine

## 2015-04-24 NOTE — Telephone Encounter (Signed)
Patient Name: Chris Stone DOB: Nov 10, 1950 Initial Comment Caller states he was mountain biking yesterday, had episode of heart skipping. Spoke to MD Letvak yesterday, advised to call and make appt with PCP Burchette. Feels fine this morning. Office transferred due to situation. Nurse Assessment Nurse: Marcelline Deist, RN, Lynda Date/Time (Eastern Time): 04/24/2015 9:30:05 AM Confirm and document reason for call. If symptomatic, describe symptoms. You must click the next button to save text entered. ---Caller states he was mountain biking yesterday, had episode of heart skipping. Went on for a few minutes. Beating twice, skipping once, then beating 3 times, skipping beat, etc. Spoke to MD Letvak yesterday, advised to call and make appt with PCP Burchette. Feels fine this morning. Office transferred due to situation. He recommended an echocardiogram. No associated symptoms, nothing since. Has the patient traveled out of the country within the last 30 days? ---Not Applicable Does the patient have any new or worsening symptoms? ---Yes Will a triage be completed? ---Yes Related visit to physician within the last 2 weeks? ---No Does the PT have any chronic conditions? (i.e. diabetes, asthma, etc.) ---No Is this a behavioral health or substance abuse call? ---No Guidelines Guideline Title Affirmed Question Affirmed Notes Heart Rate and Heartbeat Questions [1] Skipped or extra beat(s) AND [2] increases with exercise or exertion Final Disposition User See Physician within 4 Hours (or PCP triage) Marcelline Deist, RN, Lynda Comments Scheduled on Wed. am with his PCP, however if there are any cancellations or earlier appts., please contact patient at this #. Referrals REFERRED TO PCP OFFICE Disagree/Comply: Comply

## 2015-04-24 NOTE — Telephone Encounter (Signed)
Agree with assess on Wednesday and sooner for any dizziness, syncope, chest pain, or other concerns.

## 2015-04-24 NOTE — Telephone Encounter (Signed)
Pt is aware.  

## 2015-04-24 NOTE — Telephone Encounter (Signed)
FYI

## 2015-04-24 NOTE — Telephone Encounter (Signed)
Agree with further assess.  Pt has appt on Wednesday.

## 2015-04-26 ENCOUNTER — Ambulatory Visit (INDEPENDENT_AMBULATORY_CARE_PROVIDER_SITE_OTHER): Payer: BLUE CROSS/BLUE SHIELD | Admitting: Family Medicine

## 2015-04-26 VITALS — BP 118/80 | HR 76 | Temp 98.0°F | Ht 69.5 in | Wt 151.0 lb

## 2015-04-26 DIAGNOSIS — R002 Palpitations: Secondary | ICD-10-CM

## 2015-04-26 NOTE — Progress Notes (Signed)
Pre visit review using our clinic review tool, if applicable. No additional management support is needed unless otherwise documented below in the visit note. 

## 2015-04-26 NOTE — Patient Instructions (Signed)
Premature Ventricular Contraction A premature ventricular contraction is an irregularity in the normal heart rhythm. These contractions are extra heartbeats that occur too early in the normal sequence. In most cases, these contractions are harmless and do not require treatment. CAUSES Premature ventricular contractions may occur without a known cause. In healthy people, the extra contractions may be caused by:  Smoking.  Drinking alcohol.  Caffeine.  Certain medicines.  Some illegal drugs.  Stress. Sometimes, changes in chemicals in the blood (electrolytes) can also cause premature ventricular contractions. They can also occur in people with heart diseases that cause a decrease in blood flow to the heart. SIGNS AND SYMPTOMS Premature ventricular contractions often do not cause any symptoms. In some cases, you may have a feeling of your heart beating fast or skipping a beat (palpitations). DIAGNOSIS Your health care provider will take your medical history and do a physical exam. During the exam, the health care provider will check for irregular heartbeats. Various tests may be done to help diagnose premature ventricular contractions. These tests may include:  An ECG (electrocardiogram) to monitor the electrical activity of your heart.  Holter monitor testing. A Holter monitor is a portable device that can monitor the electrical activity of your heart over longer periods of time.  Stress tests to see how exercise affects your heart rhythm.  Echocardiogram. This test uses sound waves (ultrasound) to produce an image of your heart.  Electrophysiology study. This is used to evaluate the electrical conduction system of your heart. TREATMENT Usually, no treatment is needed. You may be advised to avoid things that can trigger the premature contractions, such as caffeine or alcohol. Medicines are sometimes given if symptoms are severe or if the extra heartbeats are very frequent. Treatment may  also be needed for an underlying cause of the contractions if one is found. HOME CARE INSTRUCTIONS  Take medicines only as directed by your health care provider.  Make any lifestyle changes recommended by your health care provider. These may include:  Quitting smoking.  Avoiding or limiting caffeine or alcohol.  Exercising. Talk to your health care provider about what type of exercise is safe for you.  Trying to reduce stress.  Keep all follow-up visits with your health care provider. This is important. SEEK IMMEDIATE MEDICAL CARE IF:  You feel palpitations that are frequent or continual.  You have chest pain.  You have shortness of breath.  You have sweating for no reason.  You have nausea and vomiting.  You become light-headed or faint.   This information is not intended to replace advice given to you by your health care provider. Make sure you discuss any questions you have with your health care provider.   Document Released: 08/18/2003 Document Revised: 01/21/2014 Document Reviewed: 06/03/2013 Elsevier Interactive Patient Education 2016 [employer]  

## 2015-04-26 NOTE — Progress Notes (Signed)
   Subjective:    Patient ID: Chris Stone, male    DOB: [birth year], 65 y.o.   MRN: [id number]  HPI Patient seen for follow-up evaluation regarding episode of abnormal heartbeat this past Sunday while mountain biking. He had been biking for about 45 minutes fairly high-level exertion and noticed sensation of skipped beat initially with 2 normal beats and one skip and then subsequently 3 beats with one skip. He never had any associated dizziness, dyspnea, chest pain. Episode lasted about 30 seconds.  He is very fit and sometimes cycles up to 3 hours per day. No prior history of heart issues. Other than age, low risk for CAD. He had no problems with exercise intolerance and continued to cycle, though at a more leisurely pace.  Does drink lots of caffeine in terms of coffee.  Past Medical History  Diagnosis Date  . ABDOMINAL MASS 07/21/2008  . ALLERGIC RHINITIS 05/09/2008  . DYSPHAGIA UNSPECIFIED 05/09/2008  . Microscopic hematuria 05/09/2008  . Squamous cell carcinoma (HCC)     nose   Past Surgical History  Procedure Laterality Date  . Tonsillectomy    . Knee arthroscopy w/ acl reconstruction      reports that he has never smoked. He has never used smokeless tobacco. He reports that he drinks alcohol. He reports that he does not use illicit drugs. family history includes Cancer (age of onset: 65) in his father; Hypertension in his father; Stroke in his father. There is no history of Arthritis. Allergies  Allergen Reactions  . Penicillins     As a child, rash      Review of Systems  Constitutional: Negative for appetite change, fatigue and unexpected weight change.  Eyes: Negative for visual disturbance.  Respiratory: Negative for cough, chest tightness and shortness of breath.   Cardiovascular: Positive for palpitations. Negative for chest pain and leg swelling.  Neurological: Negative for dizziness, syncope, weakness, light-headedness and headaches.       Objective:   Physical Exam  Constitutional: He is oriented to person, place, and time. He appears well-developed and well-nourished.  HENT:  Right Ear: External ear normal.  Left Ear: External ear normal.  Mouth/Throat: Oropharynx is clear and moist.  Eyes: Pupils are equal, round, and reactive to light.  Neck: Neck supple. No thyromegaly present.  Cardiovascular: Normal rate, regular rhythm and normal heart sounds.  Exam reveals no gallop and no friction rub.   No murmur heard. Pulmonary/Chest: Effort normal and breath sounds normal. No respiratory distress. He has no wheezes. He has no rales.  Musculoskeletal: He exhibits no edema.  Neurological: He is alert and oriented to person, place, and time.          Assessment & Plan:  Palpitation. Patient is describing recent event of likely ventricular trigeminy alternating with quadrigeminy with exercise.  He did not have any other concerning symptoms and has low risk for ischemic issues. Doubt structural heart disease but will obtain echocardiogram. Repeat EKG today. Reduce caffeine intake. Consider cardiology/EP evaluation if symptoms persist or recur and also would consider event monitor and possible stress testing for any persistent symptoms   EKG shows normal sinus rhythm with no acute ST-T-segment changes.

## 2015-04-27 ENCOUNTER — Encounter: Payer: Self-pay | Admitting: Family Medicine

## 2015-04-27 DIAGNOSIS — I499 Cardiac arrhythmia, unspecified: Secondary | ICD-10-CM

## 2015-04-29 NOTE — Telephone Encounter (Signed)
Refer to E-mail.  Recurrent rhythm disturbance with exercise.  No associated chest pain, dizziness, or exercise intolerance.  Will set up with EP Cardiology specialist.

## 2015-05-09 ENCOUNTER — Telehealth: Payer: Self-pay | Admitting: Family Medicine

## 2015-05-09 NOTE — Telephone Encounter (Signed)
OK 

## 2015-05-09 NOTE — Telephone Encounter (Signed)
Dr. Elease Hashimoto spoke with Mr. Bittman to schedule the Echo.  He stated that he wanted to wait till after he see Dr. Caryl Comes on 06-01-15 to discuss which type of echo he need to have done.

## 2015-05-29 ENCOUNTER — Encounter: Payer: Self-pay | Admitting: Internal Medicine

## 2015-06-01 ENCOUNTER — Encounter: Payer: Self-pay | Admitting: Internal Medicine

## 2015-06-01 ENCOUNTER — Ambulatory Visit (INDEPENDENT_AMBULATORY_CARE_PROVIDER_SITE_OTHER): Payer: BLUE CROSS/BLUE SHIELD | Admitting: Internal Medicine

## 2015-06-01 VITALS — BP 114/78 | HR 76 | Ht 70.0 in | Wt 148.8 lb

## 2015-06-01 DIAGNOSIS — R002 Palpitations: Secondary | ICD-10-CM | POA: Diagnosis not present

## 2015-06-01 NOTE — Patient Instructions (Signed)
Medication Instructions: - Your physician recommends that you continue on your current medications as directed. Please refer to the Current Medication list given to you today.  Labwork: - none  Procedures/Testing: - Your physician has requested that you have an echocardiogram. Echocardiography is a painless test that uses sound waves to create images of your heart. It provides your doctor with information about the size and shape of your heart and how well your heart's chambers and valves are working. This procedure takes approximately one hour. There are no restrictions for this procedure.  Follow-Up: - Your physician recommends that you schedule a follow-up appointment in: 4 weeks with Dr. Caryl Comes.  Any Additional Special Instructions Will Be Listed Below (If Applicable). - Alive Cor app    If you need a refill on your cardiac medications before your next appointment, please call your pharmacy.

## 2015-06-01 NOTE — Progress Notes (Signed)
ELECTROPHYSIOLOGY CONSULT NOTE  Patient ID: POET STUTE, MRN: [id number], DOB/AGE: 1950/02/11 65 y.o. Admit date: (Not on file) Date of Consult: 06/01/2015  Primary Physician: Eulas Post, MD Primary Cardiologist: [facility] bURCHETTE  Chief Complaint: PALPITATIONS   HPI Chris Stone is a 65 y.o. male  Referred for palpitations.  He is an avid bicyclist. A few months ago he was finishing up a intense bike ride which is his norm. He started feeling palpitations he was slowing down. He took his pulse and it was his impression that there was regular irregularity that these 2 beats and skipped beats and skipped followed as his heart rate slowed with 3 beats and skipped etc. He does note that his heart rate at this point would have been about 175 bpm. He has had no resting palpitations.  He has resumed biking and found that he would reproducibly initiate these tachypalpitations. They stopped spontaneously after a few minutes.  He has not noted any change in his exercise tolerance; he is exceedingly fit.  He does not snore  He does work note that on a ECG years ago there was some irregularity  Past Medical History  Diagnosis Date  . ABDOMINAL MASS 07/21/2008  . ALLERGIC RHINITIS 05/09/2008  . DYSPHAGIA UNSPECIFIED 05/09/2008  . Microscopic hematuria 05/09/2008  . Squamous cell carcinoma (HCC)     nose      Surgical History:  Past Surgical History  Procedure Laterality Date  . Tonsillectomy    . Knee arthroscopy w/ acl reconstruction       Home Meds: Prior to Admission medications   Medication Sig Start Date End Date Taking? Authorizing Provider  b complex vitamins tablet Take 1 tablet by mouth daily.      Historical Provider, MD  ibuprofen (ADVIL,MOTRIN) 200 MG tablet Take 200 mg by mouth every 6 (six) hours as needed.      Historical Provider, MD    Allergies:  Allergies  Allergen Reactions  . Penicillins     As a child, rash     Social History   Social History  . Marital Status: Married    Spouse Name: N/A  . Number of Children: N/A  . Years of Education: N/A   Occupational History  . Not on file.   Social History Main Topics  . Smoking status: Never Smoker   . Smokeless tobacco: Never Used  . Alcohol Use: 0.0 oz/week    0 Standard drinks or equivalent per week     Comment: occ  . Drug Use: No  . Sexual Activity: Not on file   Other Topics Concern  . Not on file   Social History Narrative     Family History  Problem Relation Age of Onset  . Arthritis Neg Hx     family  . Cancer Father 60    prostate  . Hypertension Father   . Stroke Father      ROS:  Please see the history of present illness.     All other systems reviewed and negative.    Physical Exam: Blood pressure 114/78, pulse 76, height 5\' 10"  (1.778 m), weight 148 lb 12.8 oz (67.495 kg). General: Well developed, well nourished male in no acute distress. Head: Normocephalic, atraumatic, sclera non-icteric, no xanthomas, nares are without discharge. EENT: normal  Lymph Nodes:  none Neck: Negative for carotid bruits. JVD not elevated. Back:without scoliosis kyphosis Lungs: Clear bilaterally to auscultation without wheezes, rales, or rhonchi. Breathing  is unlabored. Heart: RRR with S1 S2. 2/6 systolic murmur . No rubs, or gallops appreciated. Abdomen: Soft, non-tender, non-distended with normoactive bowel sounds. No hepatomegaly. No rebound/guarding. No obvious abdominal masses. Msk:  Strength and tone appear normal for age. Extremities: No clubbing or cyanosis. No * edema.  Distal pedal pulses are 2+ and equal bilaterally. Skin: Warm and Dry Neuro: Alert and oriented X 3. CN III-XII intact Grossly normal sensory and motor function . Psych:  Responds to questions appropriately with a normal affect.      Labs: Cardiac Enzymes No results for input(s): CKTOTAL, CKMB, TROPONINI in the last 72 hours. CBC Lab Results   Component Value Date   WBC 6.4 04/26/2014   HGB 14.6 04/26/2014   HCT 42.1 04/26/2014   MCV 94.1 04/26/2014   PLT 251.0 04/26/2014   PROTIME: No results for input(s): LABPROT, INR in the last 72 hours. Chemistry No results for input(s): NA, K, CL, CO2, BUN, CREATININE, CALCIUM, PROT, BILITOT, ALKPHOS, ALT, AST, GLUCOSE in the last 168 hours.  Invalid input(s): LABALBU Lipids Lab Results  Component Value Date   CHOL 205* 04/26/2014   HDL 64.60 04/26/2014   LDLCALC 124* 04/26/2014   TRIG 82.0 04/26/2014   BNP No results found for: PROBNP Thyroid Function Tests: No results for input(s): TSH, T4TOTAL, T3FREE, THYROIDAB in the last 72 hours.  Invalid input(s): FREET3 Miscellaneous No results found for: DDIMER  Radiology/Studies:  No results found.  EKG: *nsr    Assessment and Plan:  tachypalpitations  The patient has exercise associated tachypalpitations. While it is described as a trigeminal or quadrigeminal rhythm this seems much more likely in this older guy who is very fit that he has exercise associated atrial fibrillation. We will use AliveCor monitor to try to elucidate mechanism.     Virl Axe

## 2015-06-16 ENCOUNTER — Ambulatory Visit (HOSPITAL_COMMUNITY): Payer: BLUE CROSS/BLUE SHIELD | Attending: Cardiology

## 2015-06-16 ENCOUNTER — Other Ambulatory Visit: Payer: Self-pay

## 2015-06-16 DIAGNOSIS — R002 Palpitations: Secondary | ICD-10-CM | POA: Diagnosis not present

## 2015-06-16 DIAGNOSIS — I340 Nonrheumatic mitral (valve) insufficiency: Secondary | ICD-10-CM | POA: Insufficient documentation

## 2015-06-16 DIAGNOSIS — I517 Cardiomegaly: Secondary | ICD-10-CM | POA: Insufficient documentation

## 2015-06-16 DIAGNOSIS — I071 Rheumatic tricuspid insufficiency: Secondary | ICD-10-CM | POA: Diagnosis not present

## 2015-06-22 ENCOUNTER — Other Ambulatory Visit: Payer: Self-pay | Admitting: *Deleted

## 2015-06-22 DIAGNOSIS — I493 Ventricular premature depolarization: Secondary | ICD-10-CM

## 2015-06-23 ENCOUNTER — Telehealth: Payer: Self-pay

## 2015-06-23 NOTE — Telephone Encounter (Signed)
Pt contacted and given instructions and directions for his exercise myoview scheduled for 6/12 at 7:45am. Pt educated and knows he will be walking on the treadmill to get to HR. Pt verbalized understanding, additional no questions at this time.

## 2015-06-26 ENCOUNTER — Ambulatory Visit (HOSPITAL_COMMUNITY): Payer: BLUE CROSS/BLUE SHIELD | Attending: Cardiovascular Disease

## 2015-06-26 ENCOUNTER — Telehealth: Payer: Self-pay | Admitting: Internal Medicine

## 2015-06-26 DIAGNOSIS — R9439 Abnormal result of other cardiovascular function study: Secondary | ICD-10-CM | POA: Insufficient documentation

## 2015-06-26 DIAGNOSIS — I493 Ventricular premature depolarization: Secondary | ICD-10-CM | POA: Insufficient documentation

## 2015-06-26 DIAGNOSIS — R002 Palpitations: Secondary | ICD-10-CM | POA: Diagnosis not present

## 2015-06-26 LAB — MYOCARDIAL PERFUSION IMAGING
CSEPED: 10 min
CSEPEW: 11.7 METS
CSEPPHR: 169 {beats}/min
Exercise duration (sec): 0 s
LHR: 0.25
LV dias vol: 113 mL (ref 62–150)
LVSYSVOL: 54 mL
MPHR: 156 {beats}/min
NUC STRESS TID: 0.85
Percent HR: 108 %
Rest HR: 61 {beats}/min
SDS: 2
SRS: 5
SSS: 5

## 2015-06-26 MED ORDER — TECHNETIUM TC 99M TETROFOSMIN IV KIT
31.5000 | PACK | Freq: Once | INTRAVENOUS | Status: AC | PRN
  Administered 2015-06-26: 32 via INTRAVENOUS
  Filled 2015-06-26: qty 32

## 2015-06-26 MED ORDER — TECHNETIUM TC 99M TETROFOSMIN IV KIT
10.6000 | PACK | Freq: Once | INTRAVENOUS | Status: AC | PRN
  Administered 2015-06-26: 11 via INTRAVENOUS
  Filled 2015-06-26: qty 11

## 2015-06-26 NOTE — Telephone Encounter (Signed)
I called and spoke with the patient. I advised him of the process for reading/ reviewing myoviews. I made him aware that I will look for these results and have Dr. Caryl Comes review ASAP. He is agreeable.

## 2015-06-26 NOTE — Telephone Encounter (Signed)
New Message  Pt calling to see if the myoview he did today 6/12 could be reviewed and he receive the results before 12 noon tomorrow- 6/13- if at all possible. Please call back and discuss.

## 2015-06-27 NOTE — Telephone Encounter (Signed)
I called and spoke with the patient.  He is aware I have placed the order for his Cardiac MRI- he will need pre-med for the scanner. I advised I will review with Dr. Caryl Comes and have this RX at the front desk for him for pick. We will cancel his appt 6/15 with Dr. Caryl Comes and r/s for after the Cardiac MRI. He is going to touch base with the [employer] to determine if the Cardiac MRI will be enough for him to fly if it comes back normal. Per the patient, he feels they are wanting him to have a cath since the myoview was abnormal. He will call me back once he confirms.  Will forward to Dr. Caryl Comes for pre-med order.

## 2015-06-27 NOTE — Telephone Encounter (Signed)
New Message  PT requesting to speak with RN. Pt states he spoke with Dr. Caryl Comes 6/12 and was waiting on a returned call. Pt wanted to know if his still needed to keep is appt for 6/15. Please call back to discuss

## 2015-06-29 ENCOUNTER — Telehealth: Payer: Self-pay | Admitting: Internal Medicine

## 2015-06-29 ENCOUNTER — Encounter: Payer: Self-pay | Admitting: Internal Medicine

## 2015-06-29 ENCOUNTER — Ambulatory Visit: Payer: BLUE CROSS/BLUE SHIELD | Admitting: Internal Medicine

## 2015-06-29 NOTE — Telephone Encounter (Signed)
Called the patient and gave him the date, time and location of cardiac MRI.  Letter mailed today.

## 2015-07-04 MED ORDER — DIAZEPAM 5 MG PO TABS: ORAL_TABLET | ORAL | Status: DC

## 2015-07-04 NOTE — Telephone Encounter (Signed)
The patient is aware that his valium 5 mg tablet RX is at the front desk for pick up. He is aware to take this on arrival to MRI.

## 2015-07-06 ENCOUNTER — Ambulatory Visit (HOSPITAL_COMMUNITY)
Admission: RE | Admit: 2015-07-06 | Discharge: 2015-07-06 | Disposition: A | Discharge status: Home / Self Care | Payer: BLUE CROSS/BLUE SHIELD | Source: Ambulatory Visit | Attending: Internal Medicine | Admitting: Internal Medicine

## 2015-07-06 DIAGNOSIS — R9439 Abnormal result of other cardiovascular function study: Secondary | ICD-10-CM | POA: Insufficient documentation

## 2015-07-06 LAB — CREATININE, SERUM
Creatinine, Ser: 0.96 mg/dL (ref 0.61–1.24)
GFR calc Af Amer: >60 mL/min (ref >60)
GFR calc non Af Amer: >60 mL/min (ref >60)

## 2015-07-06 MED ORDER — GADOBENATE DIMEGLUMINE 529 MG/ML IV SOLN
20.0000 mL | Freq: Once | INTRAVENOUS | Status: AC
  Administered 2015-07-06: 20 mL via INTRAVENOUS

## 2015-07-11 ENCOUNTER — Telehealth: Payer: Self-pay | Admitting: Internal Medicine

## 2015-07-11 NOTE — Telephone Encounter (Signed)
Pt calling for test results and has questions regarding paper work pls call

## 2015-07-11 NOTE — Telephone Encounter (Signed)
Dr. Caryl Comes attempted to contact the patient.

## 2015-07-14 ENCOUNTER — Telehealth: Payer: Self-pay | Admitting: Internal Medicine

## 2015-07-14 NOTE — Telephone Encounter (Signed)
Patient called here to the [facility] office today- I spoke with him ~ 4:30 pm. He was adamant that his paperwork for the [facility] be scanned in and emailed to him today. I advised the patient that I am at a different location from his paperwork and that I am not allowed by [facility] to e-mail that outside the system. Attempted to contact clinic manager without success/ clinic supervisor (gone for the day). Discussed with medical records- staff with authority to possibly do this is gone for the day. Reviewed with Dr. Caryl Comes- he will get the paperwork to the patient. He has texted the patient that he will attempt to take care of this for him.

## 2015-07-14 NOTE — Telephone Encounter (Signed)
Follow up    Pt verbalized that he wants rn to call him to notify him that she has the paperwork in hand and then scan it into the system and send it to him today 07-14-15

## 2015-07-14 NOTE — Telephone Encounter (Signed)
Chris Stone is calling because Dr. Caryl Comes left some paperwork for him up and is asking if it could be faxed or email to him . He is currently out of town and is needing to have to his company before Friday 07/21/15 .

## 2015-07-14 NOTE — Telephone Encounter (Signed)
Per Dr. Caryl Comes, the patient is aware of his cardiac MRI- he also spoke with him about paperwork that he brought by for the [facility]. Dr. Caryl Comes has completed this form and left a message for the patient that it is at the front desk for pick up.

## 2015-08-01 ENCOUNTER — Ambulatory Visit (INDEPENDENT_AMBULATORY_CARE_PROVIDER_SITE_OTHER): Payer: BLUE CROSS/BLUE SHIELD | Admitting: Internal Medicine

## 2015-08-01 ENCOUNTER — Encounter: Payer: Self-pay | Admitting: Internal Medicine

## 2015-08-01 VITALS — BP 118/65 | HR 62 | Ht 70.0 in | Wt 156.0 lb

## 2015-08-01 DIAGNOSIS — R002 Palpitations: Secondary | ICD-10-CM | POA: Diagnosis not present

## 2015-08-01 NOTE — Patient Instructions (Signed)
Medication Instructions: - Your physician recommends that you continue on your current medications as directed. Please refer to the Current Medication list given to you today.  Labwork: - none  Procedures/Testing: - none  Follow-Up: - Dr. Klein will see you back on an as needed basis.  Any Additional Special Instructions Will Be Listed Below (If Applicable).     If you need a refill on your cardiac medications before your next appointment, please call your pharmacy.   

## 2015-08-01 NOTE — Progress Notes (Signed)
      Patient Care Team: Eulas Post, MD as PCP - General   HPI  Chris Stone is a 65 y.o. male Comes in with his wife today to review next steps.  He has a history of palpitations with biking. Exercise testing demonstrated PACs and monomorphic PVCs with exercise. They have a left bundle Branch morphology but have an RS configuration in leads R and L suggesting that they are not on the dome    A Myoview scan had demonstrated perfusion defects. This was not confirmed on MRI scanning which appeared normal. Echocardiogram demonstrated normal LV systolic function with mild LV relaxation but no evidence of elevated filling pressures. There is mild RAE but normal LA size  He continues to be able to exercise vigorously.  Past Medical History  Diagnosis Date  . ABDOMINAL MASS 07/21/2008  . ALLERGIC RHINITIS 05/09/2008  . DYSPHAGIA UNSPECIFIED 05/09/2008  . Microscopic hematuria 05/09/2008  . Squamous cell carcinoma (HCC)     nose    Past Surgical History  Procedure Laterality Date  . Tonsillectomy    . Knee arthroscopy w/ acl reconstruction      Current Outpatient Prescriptions  Medication Sig Dispense Refill  . b complex vitamins tablet Take 1 tablet by mouth daily.       No current facility-administered medications for this visit.    Allergies  Allergen Reactions  . Penicillins     As a child, rash      Review of Systems negative except from HPI and PMH  Physical Exam BP 118/65 mmHg  Pulse 62  Ht 5\' 10"  (1.778 m)  Wt 156 lb (70.761 kg)  BMI 22.38 kg/m2  SpO2 98% Well developed and well nourished in no acute distress HENT normal E scleral and icterus clear Neck Supple Soft with active bowel sounds No clubbing cyanosi Edema Alert and oriented, grossly normal motor and sensory function Skin Warm and Dry    Assessment and  Plan  PVC  Abnormal Myoview scan   We have reviewed the testing results. Currently he is then suspended from [employer] pending  formal review in September.  Based on this MRI testing results, I have no problems with him continuing to exercise as vigorously as he would like.  More than 50% of 45 min was spent in counseling related to the above

## 2015-08-07 ENCOUNTER — Telehealth: Payer: Self-pay | Admitting: Internal Medicine

## 2015-08-07 NOTE — Telephone Encounter (Signed)
I left a message for the patient on his identified voice mail that his echo & cardiac MRI are now on disk and available for pick up. I asked that he call back to confirm this message.

## 2015-08-08 NOTE — Telephone Encounter (Signed)
Informed patient that all the paperwork & CD will be left at front desk for him to pick up this afternoon. Patient thanks [place] for helping with this matter.

## 2015-08-08 NOTE — Telephone Encounter (Signed)
Follow-up      The pt is calling to come by and pick-up his disk at the front desk today before this afternoon.   Heather had left the message to get the disk together for the pt

## 2015-10-23 ENCOUNTER — Encounter: Payer: Self-pay | Admitting: Family Medicine

## 2015-11-10 ENCOUNTER — Encounter: Payer: Self-pay | Admitting: Internal Medicine

## 2015-11-20 ENCOUNTER — Encounter: Payer: Self-pay | Admitting: Internal Medicine

## 2015-11-22 ENCOUNTER — Telehealth: Payer: Self-pay | Admitting: Internal Medicine

## 2015-11-22 DIAGNOSIS — R002 Palpitations: Secondary | ICD-10-CM

## 2015-11-22 DIAGNOSIS — I493 Ventricular premature depolarization: Secondary | ICD-10-CM

## 2015-11-22 NOTE — Telephone Encounter (Signed)
Pt is calling to speak with a nurse

## 2015-11-23 NOTE — Telephone Encounter (Signed)
Responded to patient through open MyChart "patient advice request."  Ok per Dr. Caryl Comes to order 2 week event monitor and TSH/Free T4 per FAA recommendations.  Orders placed and message sent to [facility] to arrange scheduling.

## 2015-11-28 ENCOUNTER — Telehealth: Payer: Self-pay | Admitting: Internal Medicine

## 2015-11-28 ENCOUNTER — Encounter: Payer: Self-pay | Admitting: Internal Medicine

## 2015-11-28 NOTE — Telephone Encounter (Signed)
New message  Is getting a Heart monitor tomorrow/wants a particular type  Please call back and advise

## 2015-11-28 NOTE — Telephone Encounter (Signed)
I called and spoke with the patient. He is aware that our office does not have the Ziopatch monitor, however, he will have the equivalent of that placed (LifeWatch Patch). He is aware I have spoken with the monitor tech regarding his monitor. He will also have his TSH/ free T4 drawn tomorrow as well.   He voices understanding.

## 2015-11-29 ENCOUNTER — Ambulatory Visit (INDEPENDENT_AMBULATORY_CARE_PROVIDER_SITE_OTHER): Payer: BLUE CROSS/BLUE SHIELD

## 2015-11-29 ENCOUNTER — Other Ambulatory Visit: Payer: BLUE CROSS/BLUE SHIELD | Admitting: *Deleted

## 2015-11-29 DIAGNOSIS — I493 Ventricular premature depolarization: Secondary | ICD-10-CM

## 2015-11-29 DIAGNOSIS — R002 Palpitations: Secondary | ICD-10-CM

## 2015-11-30 LAB — T4, FREE: FREE T4: 1 ng/dL (ref 0.8–1.8)

## 2015-11-30 LAB — TSH: TSH: 1.84 mIU/L (ref 0.40–4.50)

## 2015-12-11 ENCOUNTER — Telehealth: Payer: Self-pay | Admitting: Internal Medicine

## 2015-12-11 NOTE — Telephone Encounter (Signed)
FYI

## 2015-12-11 NOTE — Telephone Encounter (Signed)
NEw MEssage  Pt call stating her received a call last week. Please call back to discuss if needed.

## 2015-12-11 NOTE — Telephone Encounter (Signed)
Pt was advised call in regards to Thyroid labs.  I advised of results, he states he has already viewed in [place] and voiced thanks.

## 2015-12-23 ENCOUNTER — Encounter: Payer: Self-pay | Admitting: Internal Medicine

## 2015-12-27 ENCOUNTER — Encounter: Payer: Self-pay | Admitting: Internal Medicine

## 2016-01-23 ENCOUNTER — Other Ambulatory Visit (INDEPENDENT_AMBULATORY_CARE_PROVIDER_SITE_OTHER): Payer: BLUE CROSS/BLUE SHIELD

## 2016-01-23 DIAGNOSIS — Z0000 Encounter for general adult medical examination without abnormal findings: Secondary | ICD-10-CM | POA: Diagnosis not present

## 2016-01-23 LAB — BASIC METABOLIC PANEL
BUN: 22 mg/dL (ref 6–23)
CO2: 26 mEq/L (ref 19–32)
Calcium: 9 mg/dL (ref 8.4–10.5)
Chloride: 104 mEq/L (ref 96–112)
Creatinine, Ser: 0.98 mg/dL (ref 0.40–1.50)
GFR: 81.6 mL/min (ref >60.00)
Glucose, Bld: 96 mg/dL (ref 70–99)
POTASSIUM: 3.9 meq/L (ref 3.5–5.1)
Sodium: 138 mEq/L (ref 135–145)

## 2016-01-23 LAB — CBC WITH DIFFERENTIAL/PLATELET
Basophils Absolute: 0 10*3/uL (ref 0.0–0.1)
Basophils Relative: 0.6 % (ref 0.0–3.0)
EOS ABS: 0.1 10*3/uL (ref 0.0–0.7)
Eosinophils Relative: 1.9 % (ref 0.0–5.0)
HCT: 40 % (ref 39.0–52.0)
HEMOGLOBIN: 14 g/dL (ref 13.0–17.0)
Lymphocytes Relative: 40.6 % (ref 12.0–46.0)
Lymphs Abs: 1.7 10*3/uL (ref 0.7–4.0)
MCHC: 34.9 g/dL (ref 30.0–36.0)
MCV: 94.9 fl (ref 78.0–100.0)
MONO ABS: 0.4 10*3/uL (ref 0.1–1.0)
Monocytes Relative: 9.7 % (ref 3.0–12.0)
Neutro Abs: 2 10*3/uL (ref 1.4–7.7)
Neutrophils Relative %: 47.2 % (ref 43.0–77.0)
Platelets: 264 10*3/uL (ref 150.0–400.0)
RBC: 4.22 Mil/uL (ref 4.22–5.81)
RDW: 13.3 % (ref 11.5–15.5)
WBC: 4.2 10*3/uL (ref 4.0–10.5)

## 2016-01-23 LAB — LIPID PANEL
CHOL/HDL RATIO: 3
Cholesterol: 240 mg/dL — ABNORMAL HIGH (ref 0–200)
HDL: 73.4 mg/dL (ref >39.00)
LDL CALC: 147 mg/dL — AB (ref 0–99)
NonHDL: 166.16
TRIGLYCERIDES: 97 mg/dL (ref 0.0–149.0)
VLDL: 19.4 mg/dL (ref 0.0–40.0)

## 2016-01-23 LAB — HEPATIC FUNCTION PANEL
ALT: 35 U/L (ref 0–53)
AST: 25 U/L (ref 0–37)
Albumin: 4.3 g/dL (ref 3.5–5.2)
Alkaline Phosphatase: 55 U/L (ref 39–117)
BILIRUBIN TOTAL: 1.8 mg/dL — AB (ref 0.2–1.2)
Bilirubin, Direct: 0.3 mg/dL (ref 0.0–0.3)
Total Protein: 6.8 g/dL (ref 6.0–8.3)

## 2016-01-23 LAB — PSA: PSA: 5.9 ng/mL — AB (ref 0.10–4.00)

## 2016-01-23 LAB — TSH: TSH: 3.22 u[IU]/mL (ref 0.35–4.50)

## 2016-01-29 ENCOUNTER — Ambulatory Visit (INDEPENDENT_AMBULATORY_CARE_PROVIDER_SITE_OTHER): Payer: BLUE CROSS/BLUE SHIELD | Admitting: Family Medicine

## 2016-01-29 ENCOUNTER — Encounter: Payer: Self-pay | Admitting: Family Medicine

## 2016-01-29 VITALS — BP 120/64 | HR 70 | Ht 69.5 in | Wt 154.0 lb

## 2016-01-29 DIAGNOSIS — Z0000 Encounter for general adult medical examination without abnormal findings: Secondary | ICD-10-CM

## 2016-01-29 DIAGNOSIS — Z23 Encounter for immunization: Secondary | ICD-10-CM

## 2016-01-29 DIAGNOSIS — Z2911 Encounter for prophylactic immunotherapy for respiratory syncytial virus (RSV): Secondary | ICD-10-CM | POA: Diagnosis not present

## 2016-01-29 DIAGNOSIS — R972 Elevated prostate specific antigen [PSA]: Secondary | ICD-10-CM

## 2016-01-29 NOTE — Patient Instructions (Signed)
We will make Urology referral.

## 2016-01-29 NOTE — Progress Notes (Signed)
Pre visit review using our clinic review tool, if applicable. No additional management support is needed unless otherwise documented below in the visit note. 

## 2016-01-29 NOTE — Progress Notes (Signed)
Subjective:     Patient ID: Chris Stone, male   DOB: March 08, 1950, 66 y.o.   MRN: [id number]  HPI Patient seen for physical exam. 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 He stays very active. He is recently started running some. Had some issues with some PVCs over the past year. He had abnormal Myoview with subsequent normal MRI scan. Had no recent exertional chest pain or dizziness whatsoever.  He declines flu vaccine. Colonoscopy will need to be repeated next year. Tetanus up-to-date. No history of shingles vaccine. No contraindications.  He's had mild increased PSA velocity previously. No obstructive urinary symptoms.  Lab Results  Component Value Date   PSA 5.90 (H) 01/23/2016   PSA 3.66 11/18/2014   PSA 3.04 04/26/2014     Past Medical History:  Diagnosis Date  . ABDOMINAL MASS 07/21/2008  . ALLERGIC RHINITIS 05/09/2008  . DYSPHAGIA UNSPECIFIED 05/09/2008  . Microscopic hematuria 05/09/2008  . Squamous cell carcinoma    nose   Past Surgical History:  Procedure Laterality Date  . KNEE ARTHROSCOPY W/ ACL RECONSTRUCTION    . TONSILLECTOMY      reports that he has never smoked. He has never used smokeless tobacco. He reports that he drinks alcohol. He reports that he does not use drugs. family history includes Cancer (age of onset: 8) in his father; Hypertension in his father; Stroke in his father. Allergies  Allergen Reactions  . Penicillins     As a child, rash      Review of Systems  Constitutional: Negative for activity change, appetite change, fatigue and fever.  HENT: Negative for congestion, ear pain and trouble swallowing.   Eyes: Negative for pain and visual disturbance.  Respiratory: Negative for cough, shortness of breath and wheezing.   Cardiovascular: Negative for chest pain and palpitations.  Gastrointestinal: Negative for abdominal distention, abdominal pain, blood in stool, constipation, diarrhea, nausea, rectal pain  and vomiting.  Genitourinary: Negative for dysuria, hematuria and testicular pain.  Musculoskeletal: Negative for arthralgias and joint swelling.  Skin: Negative for rash.  Neurological: Negative for dizziness, syncope and headaches.  Hematological: Negative for adenopathy.  Psychiatric/Behavioral: Negative for confusion and dysphoric mood.       Objective:   Physical Exam  Constitutional: He is oriented to person, place, and time. He appears well-developed and well-nourished. No distress.  HENT:  Head: Normocephalic and atraumatic.  Right Ear: External ear normal.  Left Ear: External ear normal.  Mouth/Throat: Oropharynx is clear and moist.  Eyes: Conjunctivae and EOM are normal. Pupils are equal, round, and reactive to light.  Neck: Normal range of motion. Neck supple. No thyromegaly present.  Cardiovascular: Normal rate, regular rhythm and normal heart sounds.   No murmur heard. Pulmonary/Chest: No respiratory distress. He has no wheezes. He has no rales.  Abdominal: Soft. Bowel sounds are normal. He exhibits no distension and no mass. There is no tenderness. There is no rebound and no guarding.  Genitourinary:  Genitourinary Comments: We decided on not doing prostate exam since he has increased PSA velocity and we recommended urology referral regardless of exam results  Musculoskeletal: He exhibits no edema.  Lymphadenopathy:    He has no cervical adenopathy.  Neurological: He is alert and oriented to person, place, and time. He displays normal reflexes. No cranial nerve deficit.  Skin: No rash noted.  Psychiatric: He has a normal mood and affect.       Assessment:  Physical exam. Labs reviewed. Patient has elevated PSA of 5.90. No history of shingles vaccine    Plan:     -Flu vaccine offered and declined -Patient requesting shingles vaccine and he has no contraindications -Set up urology referral regarding increased PSA velocity -We'll need repeat colonoscopy by  next year  Eulas Post MD [facility] at [facility]

## 2016-04-01 LAB — PSA: PSA: 5.9

## 2016-04-10 DIAGNOSIS — R972 Elevated prostate specific antigen [PSA]: Secondary | ICD-10-CM | POA: Diagnosis not present

## 2016-04-30 DIAGNOSIS — C61 Malignant neoplasm of prostate: Secondary | ICD-10-CM | POA: Diagnosis not present

## 2016-04-30 DIAGNOSIS — R972 Elevated prostate specific antigen [PSA]: Secondary | ICD-10-CM | POA: Diagnosis not present

## 2016-05-06 DIAGNOSIS — C61 Malignant neoplasm of prostate: Secondary | ICD-10-CM | POA: Diagnosis not present

## 2016-05-07 ENCOUNTER — Encounter: Payer: Self-pay | Admitting: Radiation Oncology

## 2016-05-09 ENCOUNTER — Encounter: Payer: Self-pay | Admitting: Family Medicine

## 2016-05-11 ENCOUNTER — Encounter: Payer: Self-pay | Admitting: Radiation Oncology

## 2016-05-11 NOTE — Progress Notes (Signed)
GU Location of Tumor / Histology: prostatic adenocarcinoma  If Prostate Cancer, Gleason Score is (4 + 3) and PSA is (5.9). Prostate volume: 23.6 cc.   Chris Stone was referred by his PCP, Dr. Carolann Littler to Dr. Gaynelle Arabian for an evaluation of his elevated PSA. Dr. Gaynelle Arabian referred the patient to Dr. Alinda Money for consideration of surgery.  Biopsies of prostate (if applicable) revealed:    Past/Anticipated interventions by urology, if any: prostate biopsy, discussion about treatment options, referral to Dr. Tammi Klippel.   Past/Anticipated interventions by medical oncology, if any: no  Weight changes, if any: no  Bowel/Bladder complaints, if any: nocturia X 1  Nausea/Vomiting, if any: no  Pain issues, if any:  no  SAFETY ISSUES:  Prior radiation? no  Pacemaker/ICD? no  Possible current pregnancy? no  Is the patient on methotrexate? no  Current Complaints / other details:  66 year old male. Married. Retired Insurance underwriter.  Patient denies any issues except some distraction in recent weeks with new diagnosis from doing things he typically enjoys which is bicycling.    Vitals:   05/13/16 1322  BP: 118/67  Pulse: 80  Resp: 18  Temp: 98.2 F (36.8 C)  TempSrc: Oral  SpO2: 98%  Weight: 152 lb (68.9 kg)   Wt Readings from Last 3 Encounters:  05/13/16 152 lb (68.9 kg)  01/29/16 154 lb (69.9 kg)  08/01/15 156 lb (70.8 kg)

## 2016-05-13 ENCOUNTER — Encounter: Payer: Self-pay | Admitting: Radiation Oncology

## 2016-05-13 ENCOUNTER — Ambulatory Visit
Admission: RE | Admit: 2016-05-13 | Discharge: 2016-05-13 | Disposition: A | Discharge status: Home / Self Care | Payer: Medicare Other | Source: Ambulatory Visit | Attending: Radiation Oncology | Admitting: Radiation Oncology

## 2016-05-13 ENCOUNTER — Encounter: Payer: Self-pay | Admitting: Medical Oncology

## 2016-05-13 DIAGNOSIS — Z85828 Personal history of other malignant neoplasm of skin: Secondary | ICD-10-CM | POA: Diagnosis not present

## 2016-05-13 DIAGNOSIS — C61 Malignant neoplasm of prostate: Secondary | ICD-10-CM

## 2016-05-13 DIAGNOSIS — R972 Elevated prostate specific antigen [PSA]: Secondary | ICD-10-CM | POA: Diagnosis not present

## 2016-05-13 HISTORY — DX: Malignant neoplasm of prostate: C61

## 2016-05-13 HISTORY — DX: Basal cell carcinoma of skin, unspecified: C44.91

## 2016-05-13 NOTE — Progress Notes (Signed)
Met Mr. Chris Stone and his wife Chris Stone during consult with Dr. Tammi Klippel. I introduced myself as the prostate nurse navigator and my role. He has seen Dr. Alinda Money with [facility] to discuss surgery and today will discuss radiation with Dr. Tammi Klippel. He states he is still numb from the diagnosis and not sure how to treat. I explained they can go home, discuss and consider options.

## 2016-05-13 NOTE — Progress Notes (Signed)
Radiation Oncology         [phone] ________________________________  Initial outpatient Consultation  Name: Chris Stone MRN: [id number]  Date: 05/13/2016  DOB: [birth year]  JY:[email or web address],YQMVH W, MD  Raynelle Bring, MD   REFERRING PHYSICIAN: Raynelle Bring, MD  DIAGNOSIS:  66 year-old gentleman with intermediate risk, Stage T1c adenocarcinoma of the prostate with Gleason Score of 4+3, and PSA of 5.9.    ICD-9-CM ICD-10-CM   1. Malignant neoplasm of prostate ([place]) 185 C61     HISTORY OF PRESENT ILLNESS: Chris Stone is a 66 y.o. male with a diagnosis of prostate cancer. He was noted to have an elevated PSA of 5.9 on 01/23/16 by his primary care physician, Dr. Carolann Littler.  Accordingly, he was referred for evaluation in urology by Dr. Gaynelle Arabian on 04/10/16,  digital rectal examination was performed at that time revealing very slight induration toward the right base of the prostate which was not particularly suspicious.  The patient proceeded to transrectal ultrasound with 12 biopsies of the prostate on 04/30/16.  The prostate volume measured 23.6 cc.  Out of 12 core biopsies, 4 were positive.  The maximum Gleason score was 4+3, and this was seen in the right mid and right apex lateral. Also seen, Gleason score 3+4 in the right mid lateral and Gleason score of 3+3 in the right apex.     The patient reviewed the biopsy results with his urologist and he has kindly been referred today for discussion of potential radiation treatment options.  PREVIOUS RADIATION THERAPY: No  PAST MEDICAL HISTORY:  Past Medical History:  Diagnosis Date  . ABDOMINAL MASS 07/21/2008  . ALLERGIC RHINITIS 05/09/2008  . Basal cell carcinoma 2012?   Upper Lip  . Microscopic hematuria 05/09/2008  . Prostate cancer ([place])   . Squamous cell carcinoma    nose      PAST SURGICAL HISTORY: Past Surgical History:  Procedure Laterality Date  . KNEE ARTHROSCOPY W/ ACL RECONSTRUCTION    . PROSTATE  BIOPSY    . TONSILLECTOMY      FAMILY HISTORY:  Family History  Problem Relation Age of Onset  . Cancer Father 78    prostate  . Hypertension Father   . Stroke Father   . Arthritis Neg Hx     family    SOCIAL HISTORY:  Social History   Social History  . Marital status: Married    Spouse name: N/A  . Number of children: N/A  . Years of education: N/A   Occupational History  . Not on file.   Social History Main Topics  . Smoking status: Never Smoker  . Smokeless tobacco: Never Used  . Alcohol use 0.0 oz/week     Comment: occ  . Drug use: No  . Sexual activity: Not on file   Other Topics Concern  . Not on file   Social History Narrative  . No narrative on file    ALLERGIES: Penicillins  MEDICATIONS:  Current Outpatient Prescriptions  Medication Sig Dispense Refill  . ibuprofen (ADVIL,MOTRIN) 200 MG tablet Take 200 mg by mouth every 6 (six) hours as needed.     No current facility-administered medications for this encounter.     REVIEW OF SYSTEMS:  On review of systems, the patient reports that he is doing well overall. He denies any chest pain, shortness of breath, cough, fevers, chills, night sweats, unintended weight changes. He denies any bowel disturbances, and denies abdominal pain, nausea or vomiting. He denies any  new musculoskeletal or joint aches or pains. He denies any issues except some distraction in recent weeks with new diagnosis from doing things he typically enjoys which is bicycling. He reports nocturia x 1. His IPSS was 3, indicating mild urinary symptoms. He is able to complete sexual activity with most attempts. A complete review of systems is obtained and is otherwise negative.    PHYSICAL EXAM:  Wt Readings from Last 3 Encounters:  05/13/16 152 lb (68.9 kg)  01/29/16 154 lb (69.9 kg)  08/01/15 156 lb (70.8 kg)   Temp Readings from Last 3 Encounters:  05/13/16 98.2 F (36.8 C) (Oral)  04/26/15 98 F (36.7 C) (Oral)  11/18/14 98.2 F  (36.8 C)   BP Readings from Last 3 Encounters:  05/13/16 118/67  01/29/16 120/64  08/01/15 118/65   Pulse Readings from Last 3 Encounters:  05/13/16 80  01/29/16 70  08/01/15 62   Pain Assessment Pain Score: 0-No pain/10  In general this is a well appearing caucasian male in no acute distress. He's alert and oriented x4 and appropriate throughout the examination. Cardiopulmonary assessment is negative for acute distress and he exhibits normal effort.   KPS = 100  100 - Normal; no complaints; no evidence of disease. 90   - Able to carry on normal activity; minor signs or symptoms of disease. 80   - Normal activity with effort; some signs or symptoms of disease. 98   - Cares for self; unable to carry on normal activity or to do active work. 60   - Requires occasional assistance, but is able to care for most of his personal needs. 50   - Requires considerable assistance and frequent medical care. 49   - Disabled; requires special care and assistance. 28   - Severely disabled; hospital admission is indicated although death not imminent. 39   - Very sick; hospital admission necessary; active supportive treatment necessary. 10   - Moribund; fatal processes progressing rapidly. 0     - Dead  Karnofsky DA, Abelmann [place], Craver LS and Burchenal [facility] [phone]) The use of the nitrogen mustards in the palliative treatment of carcinoma: with particular reference to bronchogenic carcinoma Cancer 1 634-56  LABORATORY DATA:  Lab Results  Component Value Date   WBC 4.2 01/23/2016   HGB 14.0 01/23/2016   HCT 40.0 01/23/2016   MCV 94.9 01/23/2016   PLT 264.0 01/23/2016   Lab Results  Component Value Date   NA 138 01/23/2016   K 3.9 01/23/2016   CL 104 01/23/2016   CO2 26 01/23/2016   Lab Results  Component Value Date   ALT 35 01/23/2016   AST 25 01/23/2016   ALKPHOS 55 01/23/2016   BILITOT 1.8 (H) 01/23/2016     RADIOGRAPHY: No results found.    IMPRESSION: This gentleman is a 66  year-old gentleman with Stage T1c adenocarcinoma of the prostate with Gleason Score of 4+3, and PSA of 5.9.  His T-Stage, Gleason's Score, and PSA put him into the intermediate risk group.  Accordingly he is eligible for a variety of potential treatment options including prostatectomy, prostate brachytherapy, or external beam radiation.   PLAN: Today I reviewed the findings and workup thus far.  We discussed the natural history of prostate cancer.  We reviewed the the implications of T-stage, Gleason's Score, and PSA on decision-making and outcomes in prostate cancer.  We discussed radiation treatment in the management of prostate cancer with regard to the logistics and delivery of external beam  radiation treatment as well as the logistics and delivery of prostate brachytherapy and SpaceOAR.  We compared and contrasted each of these approaches and also compared these against prostatectomy.  The patient is unsure how he would like to proceed.  I will share my findings with Dr. Alinda Money and we will look forward to hearing back about the patient's decision.     I enjoyed meeting with him today, and will look forward to participating in the care of this very nice gentleman, as needed.  ------------------------------------------------   Tyler Pita, MD [facility] Director and Director of Stereotactic Radiosurgery Direct Dial: [phone]  Fax: [phone] [email or web address]  Skype  [email or web address]  This document serves as a record of services personally performed by Tyler Pita, MD. It was created on his behalf by Arlyce Harman, a trained medical scribe. The creation of this record is based on the scribe's personal observations and the provider's statements to them. This document has been checked and approved by the attending provider.

## 2016-05-14 NOTE — Addendum Note (Signed)
Encounter addended by: Heywood Footman, RN on: 05/14/2016  8:25 AM<BR>    Actions taken: Charge Capture section accepted

## 2016-05-21 ENCOUNTER — Other Ambulatory Visit: Payer: Self-pay | Admitting: Urology

## 2016-06-03 ENCOUNTER — Ambulatory Visit (INDEPENDENT_AMBULATORY_CARE_PROVIDER_SITE_OTHER): Payer: Medicare Other | Admitting: Family Medicine

## 2016-06-03 ENCOUNTER — Encounter: Payer: Self-pay | Admitting: Family Medicine

## 2016-06-03 VITALS — BP 110/70 | HR 69 | Temp 98.4°F | Wt 151.3 lb

## 2016-06-03 DIAGNOSIS — R05 Cough: Secondary | ICD-10-CM

## 2016-06-03 DIAGNOSIS — M6281 Muscle weakness (generalized): Secondary | ICD-10-CM | POA: Diagnosis not present

## 2016-06-03 DIAGNOSIS — R490 Dysphonia: Secondary | ICD-10-CM | POA: Diagnosis not present

## 2016-06-03 DIAGNOSIS — R059 Cough, unspecified: Secondary | ICD-10-CM

## 2016-06-03 DIAGNOSIS — C61 Malignant neoplasm of prostate: Secondary | ICD-10-CM | POA: Diagnosis not present

## 2016-06-03 NOTE — Progress Notes (Signed)
Subjective:     Patient ID: Chris Stone, male   DOB: 06-May-1950, 66 y.o.   MRN: [id number]  HPI Patient is seen with cough and hoarseness over the past 8 or 9 days. He's had increased allergic symptoms this spring. His cough is relatively mild and mostly dry. Worse at night. He's taken some Claritin which helps postnasal drip symptoms. Intermittent hoarseness for at least couple weeks. Patient never smoked. No facial pain. No headaches. Denies any GERD symptoms.  He has prostate cancer with Gleason score of 7 and preparing for robotic surgery in June.  Past Medical History:  Diagnosis Date  . ABDOMINAL MASS 07/21/2008  . ALLERGIC RHINITIS 05/09/2008  . Basal cell carcinoma 2012?   Upper Lip  . Microscopic hematuria 05/09/2008  . Prostate cancer ([place])   . Squamous cell carcinoma    nose   Past Surgical History:  Procedure Laterality Date  . KNEE ARTHROSCOPY W/ ACL RECONSTRUCTION    . PROSTATE BIOPSY    . TONSILLECTOMY      reports that he has never smoked. He has never used smokeless tobacco. He reports that he drinks alcohol. He reports that he does not use drugs. family history includes Cancer (age of onset: 42) in his father; Hypertension in his father; Stroke in his father. Allergies  Allergen Reactions  . Penicillins     As a child, rash     Review of Systems  Constitutional: Negative for chills and fever.  HENT: Positive for congestion and voice change. Negative for sore throat.   Respiratory: Positive for cough.   Neurological: Negative for headaches.       Objective:   Physical Exam  Constitutional: He appears well-developed and well-nourished. No distress.  HENT:  Right Ear: External ear normal.  Left Ear: External ear normal.  Mouth/Throat: Oropharynx is clear and moist.  Neck: Neck supple.  Cardiovascular: Normal rate and regular rhythm.   Pulmonary/Chest: Effort normal and breath sounds normal. No respiratory distress. He has no wheezes. He has no rales.   Lymphadenopathy:    He has no cervical adenopathy.       Assessment:     Cough. Nonfocal exam. Suspect related to allergic postnasal drip. Cannot rule out GERD    Plan:     -Continue Claritin and consider adding over-the-counter Flonase or Nasacort -Touch base if hoarseness persist another 2 weeks with the above -Consider trial of PPI if hoarseness persists  Eulas Post MD [facility] at [facility]

## 2016-06-03 NOTE — Patient Instructions (Signed)
Try some Flonase or Nasacort for postnasal drip symptoms Let me know if hoarseness becomes more consistent.

## 2016-06-04 DIAGNOSIS — C61 Malignant neoplasm of prostate: Secondary | ICD-10-CM | POA: Diagnosis not present

## 2016-06-12 DIAGNOSIS — M6281 Muscle weakness (generalized): Secondary | ICD-10-CM | POA: Diagnosis not present

## 2016-06-12 DIAGNOSIS — C61 Malignant neoplasm of prostate: Secondary | ICD-10-CM | POA: Diagnosis not present

## 2016-06-14 NOTE — Patient Instructions (Addendum)
EAN GETTEL  06/14/2016   Your procedure is scheduled on: 06-24-16  Report to [facility] Main  Entrance Take [place]  Elevators to 3rd floor to [place] at 09:15AM.    Call this number if you have problems the morning of surgery [phone]    Remember: ONLY 1 PERSON MAY GO WITH YOU TO SHORT STAY TO GET  READY MORNING OF YOUR SURGERY.  Do not eat food or drink liquids :After Midnight.    Please consume a Clear Liquid Diet the day of prep to prevent dehydration      CLEAR LIQUID DIET   Foods Allowed                                                                     Foods Excluded  Coffee and tea, regular and decaf                             liquids that you cannot  Plain Jell-O in any flavor                                             see through such as: Fruit ices (not with fruit pulp)                                     milk, soups, orange juice  Iced Popsicles                                    All solid food Carbonated beverages, regular and diet                                    Cranberry, grape and apple juices Sports drinks like Gatorade Lightly seasoned clear broth or consume(fat free) Sugar, honey syrup  Sample Menu Breakfast                                Lunch                                     Supper Cranberry juice                    Beef broth                            Chicken broth Jell-O                                     Grape juice  Apple juice Coffee or tea                        Jell-O                                      Popsicle                                                Coffee or tea                        Coffee or tea  _____________________________________________________________________       Take these medicines the morning of surgery with A SIP OF WATER: None                                You may not have any metal on your body including hair pins and   piercings  Do not wear jewelry, make-up, lotions, powders or perfumes, deodorant             Men may shave face and neck.   Do not bring valuables to the hospital. [place].  Contacts, dentures or bridgework may not be worn into surgery.  Leave suitcase in the car. After surgery it may be brought to your room.                  Please read over the following fact sheets you were given: _____________________________________________________________________   [facility] - Preparing for Surgery Before surgery, you can play an important role.  Because skin is not sterile, your skin needs to be as free of germs as possible.  You can reduce the number of germs on your skin by washing with CHG (chlorahexidine gluconate) soap before surgery.  CHG is an antiseptic cleaner which kills germs and bonds with the skin to continue killing germs even after washing. Please DO NOT use if you have an allergy to CHG or antibacterial soaps.  If your skin becomes reddened/irritated stop using the CHG and inform your nurse when you arrive at Short Stay. Do not shave (including legs and underarms) for at least 48 hours prior to the first CHG shower.  You may shave your face/neck. Please follow these instructions carefully:  1.  Shower with CHG Soap the night before surgery and the  morning of Surgery.  2.  If you choose to wash your hair, wash your hair first as usual with your  normal  shampoo.  3.  After you shampoo, rinse your hair and body thoroughly to remove the  shampoo.                           4.  Use CHG as you would any other liquid soap.  You can apply chg directly  to the skin and wash                       Gently with a scrungie or clean washcloth.  5.  Apply the  CHG Soap to your body ONLY FROM THE NECK DOWN.   Do not use on face/ open                           Wound or open sores. Avoid contact with eyes, ears mouth and genitals (private parts).                        Wash face,  Genitals (private parts) with your normal soap.             6.  Wash thoroughly, paying special attention to the area where your surgery  will be performed.  7.  Thoroughly rinse your body with warm water from the neck down.  8.  DO NOT shower/wash with your normal soap after using and rinsing off  the CHG Soap.                9.  Pat yourself dry with a clean towel.            10.  Wear clean pajamas.            11.  Place clean sheets on your bed the night of your first shower and do not  sleep with pets. Day of Surgery : Do not apply any lotions/deodorants the morning of surgery.  Please wear clean clothes to the hospital/surgery center.  FAILURE TO FOLLOW THESE INSTRUCTIONS MAY RESULT IN THE CANCELLATION OF YOUR SURGERY PATIENT SIGNATURE_________________________________  NURSE SIGNATURE__________________________________  ________________________________________________________________________   Chris Stone  An incentive spirometer is a tool that can help keep your lungs clear and active. This tool measures how well you are filling your lungs with each breath. Taking long deep breaths may help reverse or decrease the chance of developing breathing (pulmonary) problems (especially infection) following:  A long period of time when you are unable to move or be active. BEFORE THE PROCEDURE   If the spirometer includes an indicator to show your best effort, your nurse or respiratory therapist will set it to a desired goal.  If possible, sit up straight or lean slightly forward. Try not to slouch.  Hold the incentive spirometer in an upright position. INSTRUCTIONS FOR USE  1. Sit on the edge of your bed if possible, or sit up as far as you can in bed or on a chair. 2. Hold the incentive spirometer in an upright position. 3. Breathe out normally. 4. Place the mouthpiece in your mouth and seal your lips tightly around it. 5. Breathe in slowly and as  deeply as possible, raising the piston or the ball toward the top of the column. 6. Hold your breath for 3-5 seconds or for as long as possible. Allow the piston or ball to fall to the bottom of the column. 7. Remove the mouthpiece from your mouth and breathe out normally. 8. Rest for a few seconds and repeat Steps 1 through 7 at least 10 times every 1-2 hours when you are awake. Take your time and take a few normal breaths between deep breaths. 9. The spirometer may include an indicator to show your best effort. Use the indicator as a goal to work toward during each repetition. 10. After each set of 10 deep breaths, practice coughing to be sure your lungs are clear. If you have an incision (the cut made at the time of surgery), support your incision when coughing by placing a pillow or rolled  up towels firmly against it. Once you are able to get out of bed, walk around indoors and cough well. You may stop using the incentive spirometer when instructed by your caregiver.  RISKS AND COMPLICATIONS  Take your time so you do not get dizzy or light-headed.  If you are in pain, you may need to take or ask for pain medication before doing incentive spirometry. It is harder to take a deep breath if you are having pain. AFTER USE  Rest and breathe slowly and easily.  It can be helpful to keep track of a log of your progress. Your caregiver can provide you with a simple table to help with this. If you are using the spirometer at home, follow these instructions: [place] IF:   You are having difficultly using the spirometer.  You have trouble using the spirometer as often as instructed.  Your pain medication is not giving enough relief while using the spirometer.  You develop fever of 100.5 F (38.1 C) or higher. SEEK IMMEDIATE MEDICAL CARE IF:   You cough up bloody sputum that had not been present before.  You develop fever of 102 F (38.9 C) or greater.  You develop worsening pain  at or near the incision site. MAKE SURE YOU:   Understand these instructions.  Will watch your condition.  Will get help right away if you are not doing well or get worse. Document Released: 05/13/2006 Document Revised: 03/25/2011 Document Reviewed: 07/14/2006 Exit[employer] Patient Information 2014 ExitCare, [place].   ________________________________________________________________________  WHAT IS A BLOOD TRANSFUSION? Blood Transfusion Information  A transfusion is the replacement of blood or some of its parts. Blood is made up of multiple cells which provide different functions.  Red blood cells carry oxygen and are used for blood loss replacement.  White blood cells fight against infection.  Platelets control bleeding.  Plasma helps clot blood.  Other blood products are available for specialized needs, such as hemophilia or other clotting disorders. BEFORE THE TRANSFUSION  Who gives blood for transfusions?   Healthy volunteers who are fully evaluated to make sure their blood is safe. This is blood bank blood. Transfusion therapy is the safest it has ever been in the practice of medicine. Before blood is taken from a donor, a complete history is taken to make sure that person has no history of diseases nor engages in risky social behavior (examples are intravenous drug use or sexual activity with multiple partners). The donor's travel history is screened to minimize risk of transmitting infections, such as malaria. The donated blood is tested for signs of infectious diseases, such as HIV and hepatitis. The blood is then tested to be sure it is compatible with you in order to minimize the chance of a transfusion reaction. If you or a relative donates blood, this is often done in anticipation of surgery and is not appropriate for emergency situations. It takes many days to process the donated blood. RISKS AND COMPLICATIONS Although transfusion therapy is very safe and saves many lives, the  main dangers of transfusion include:   Getting an infectious disease.  Developing a transfusion reaction. This is an allergic reaction to something in the blood you were given. Every precaution is taken to prevent this. The decision to have a blood transfusion has been considered carefully by your caregiver before blood is given. Blood is not given unless the benefits outweigh the risks. AFTER THE TRANSFUSION  Right after receiving a blood transfusion, you will usually feel  much better and more energetic. This is especially true if your red blood cells have gotten low (anemic). The transfusion raises the level of the red blood cells which carry oxygen, and this usually causes an energy increase.  The nurse administering the transfusion will monitor you carefully for complications. HOME CARE INSTRUCTIONS  No special instructions are needed after a transfusion. You may find your energy is better. Speak with your caregiver about any limitations on activity for underlying diseases you may have. SEEK MEDICAL CARE IF:   Your condition is not improving after your transfusion.  You develop redness or irritation at the intravenous (IV) site. SEEK IMMEDIATE MEDICAL CARE IF:  Any of the following symptoms occur over the next 12 hours:  Shaking chills.  You have a temperature by mouth above 102 F (38.9 C), not controlled by medicine.  Chest, back, or muscle pain.  People around you feel you are not acting correctly or are confused.  Shortness of breath or difficulty breathing.  Dizziness and fainting.  You get a rash or develop hives.  You have a decrease in urine output.  Your urine turns a dark color or changes to pink, red, or brown. Any of the following symptoms occur over the next 10 days:  You have a temperature by mouth above 102 F (38.9 C), not controlled by medicine.  Shortness of breath.  Weakness after normal activity.  The white part of the eye turns yellow  (jaundice).  You have a decrease in the amount of urine or are urinating less often.  Your urine turns a dark color or changes to pink, red, or brown. Document Released: 12/29/1999 Document Revised: 03/25/2011 Document Reviewed: 08/17/2007 [facility] Patient Information 2014 [place], [place].  _______________________________________________________________________

## 2016-06-17 ENCOUNTER — Encounter (HOSPITAL_COMMUNITY)
Admission: RE | Admit: 2016-06-17 | Discharge: 2016-06-17 | Disposition: A | Discharge status: Home / Self Care | Payer: Medicare Other | Source: Ambulatory Visit | Attending: Urology | Admitting: Urology

## 2016-06-17 ENCOUNTER — Ambulatory Visit (HOSPITAL_COMMUNITY)
Admission: RE | Admit: 2016-06-17 | Discharge: 2016-06-17 | Disposition: A | Discharge status: Home / Self Care | Payer: Medicare Other | Source: Ambulatory Visit | Attending: Anesthesiology | Admitting: Anesthesiology

## 2016-06-17 ENCOUNTER — Encounter (HOSPITAL_COMMUNITY): Payer: Self-pay

## 2016-06-17 DIAGNOSIS — Z01818 Encounter for other preprocedural examination: Secondary | ICD-10-CM | POA: Insufficient documentation

## 2016-06-17 DIAGNOSIS — C61 Malignant neoplasm of prostate: Secondary | ICD-10-CM | POA: Diagnosis not present

## 2016-06-17 DIAGNOSIS — Z01810 Encounter for preprocedural cardiovascular examination: Secondary | ICD-10-CM | POA: Insufficient documentation

## 2016-06-17 DIAGNOSIS — R001 Bradycardia, unspecified: Secondary | ICD-10-CM | POA: Insufficient documentation

## 2016-06-17 DIAGNOSIS — Z01812 Encounter for preprocedural laboratory examination: Secondary | ICD-10-CM | POA: Insufficient documentation

## 2016-06-17 DIAGNOSIS — J449 Chronic obstructive pulmonary disease, unspecified: Secondary | ICD-10-CM | POA: Insufficient documentation

## 2016-06-17 HISTORY — DX: Adverse effect of unspecified anesthetic, initial encounter: T41.45XA

## 2016-06-17 HISTORY — DX: Other complications of anesthesia, initial encounter: T88.59XA

## 2016-06-17 LAB — BASIC METABOLIC PANEL
ANION GAP: 6 (ref 5–15)
BUN: 20 mg/dL (ref 6–20)
CHLORIDE: 107 mmol/L (ref 101–111)
CO2: 26 mmol/L (ref 22–32)
CREATININE: 0.84 mg/dL (ref 0.61–1.24)
Calcium: 8.6 mg/dL — ABNORMAL LOW (ref 8.9–10.3)
GFR calc Af Amer: >60 mL/min (ref >60)
GFR calc non Af Amer: >60 mL/min (ref >60)
GLUCOSE: 97 mg/dL (ref 65–99)
Potassium: 4 mmol/L (ref 3.5–5.1)
Sodium: 139 mmol/L (ref 135–145)

## 2016-06-17 LAB — CBC
HCT: 38.1 % — ABNORMAL LOW (ref 39.0–52.0)
HEMOGLOBIN: 13.1 g/dL (ref 13.0–17.0)
MCH: 32.1 pg (ref 26.0–34.0)
MCHC: 34.4 g/dL (ref 30.0–36.0)
MCV: 93.4 fL (ref 78.0–100.0)
Platelets: 252 10*3/uL (ref 150–400)
RBC: 4.08 MIL/uL — AB (ref 4.22–5.81)
RDW: 12.5 % (ref 11.5–15.5)
WBC: 4.6 10*3/uL (ref 4.0–10.5)

## 2016-06-17 LAB — ABO/RH: ABO/RH(D): A NEG

## 2016-06-21 NOTE — H&P (Signed)
Office Visit Report     06/04/2016   --------------------------------------------------------------------------------   Chris Stone  MRN: [id number]  PRIMARY CARE:    DOB: 06-14-1950, 66 year old Male  REFERRING:  Raynelle Bring, MD  SSN: [id number]  PROVIDER:  Carolan Clines, M.D.    TREATING:  Raynelle Bring, M.D.    LOCATION:  [facility] [phone]   --------------------------------------------------------------------------------   CC/HPI: CC: Prostate Cancer   PCP: Dr. 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 He has been counseled thoroughly by myself and Dr. Tammi Klippel and has elected to proceed with surgical treatment of his prostate cancer.   Family history: Father - diagnosed in mid 64s and treated with XRT.   Imaging studies: None.   PMH: He has a history of urolithiasis.  PSH: No abdominal surgeries.   TNM stage: cT2a Nx Mx (Slight right base induration)  PSA: 5.9  Gleason score: 4+3=7  Biopsy (04/30/16): 4/12 cores positive  Left: Benign  Right: R apex (5%, 3+3=6), R lateral apex (40%, 4+3=7), R mid (50%, 4+3=7, PNI), R lateral mid (50%, 3+4=7, PNI)  Prostate volume: 23.6 cc   Nomogram  OC disease: 38%  EPE: 59%  SVI: 7%  LNI: 8%  PFS (5 year, 10 year): 69%,53%   Urinary function: IPSS is 2.  Erectile function: SHIM score is 25.     ALLERGIES: Penicillins    MEDICATIONS: Advil TABS 0 Oral  Aspirin 325 mg tablet 0 Oral     GU PSH: Prostate Needle Biopsy - 04/30/2016      PSH Notes: Knee Surgery Right   NON-GU PSH: Bmi<30 And >=22 Calc & Docu - 06/03/2016 Colonoscopy Doc Meds Verified W/Pt Or Re - 06/03/2016 Other Pt/Ot Current Status - 06/03/2016 Other Pt/Ot Goal Status - 06/03/2016 Pain Doc Pos  And Plan - 06/03/2016 Removal of skin cancer Surgical Pathology, Gross And Microscopic Examination For Prostate Needle - 04/30/2016 Tonsillectomy..    GU PMH: Prostate Cancer - 05/06/2016 BPH w/LUTS, Benign localized hyperplasia of prostate with urinary obstruction - 02/24/2015 Elevated PSA, Elevated prostate specific antigen (PSA) - 02/24/2015 Nocturia, Nocturia - 02/24/2015 Urinary Frequency, Urinary frequency - 02/24/2015    NON-GU PMH: Muscle weakness (generalized) - 06/03/2016 Encounter for general adult medical examination without abnormal findings, Encounter for preventive health examination - 02/24/2015    FAMILY HISTORY: Kidney Stones - Runs In Family malignant neoplasm of prostate - Runs In Family   SOCIAL HISTORY: Marital Status: Married Current Smoking Status: Patient has never smoked.   Tobacco Use Assessment Completed: Used Tobacco in last 30 days? Does drink.  Drinks 1 caffeinated drink per day.     Notes: Alcohol use, Married, Number of children, Never a smoker, Caffeine use, Occupation   REVIEW OF SYSTEMS:    GU Review Male:   Patient reports get up at night to urinate. Patient denies frequent urination, hard to postpone urination, burning/ pain with urination, leakage of urine, stream starts and stops, trouble starting your streams, and have to strain to urinate .  Gastrointestinal (Upper):   Patient denies nausea and vomiting.  Gastrointestinal (Lower):   Patient denies diarrhea and constipation.  Constitutional:   Patient denies fever, night sweats, weight loss, and fatigue.  Skin:  Patient denies skin rash/ lesion and itching.  Eyes:   Patient denies blurred vision and double vision.  Ears/ Nose/ Throat:   Patient denies sore throat and sinus problems.  Hematologic/Lymphatic:   Patient denies swollen glands and easy bruising.  Cardiovascular:   Patient denies leg swelling and chest pains.  Respiratory:   Patient denies cough and shortness of breath.  Endocrine:    Patient denies excessive thirst.  Musculoskeletal:   Patient denies back pain and joint pain.  Neurological:   Patient denies headaches and dizziness.  Psychologic:   Patient denies depression and anxiety.   VITAL SIGNS:      06/04/2016 09:48 AM  Weight 150 lb / 68.04 kg  Height 70 in / 177.8 cm  BP 108/68 mmHg  Pulse 67 /min  BMI 21.5 kg/m   MULTI-SYSTEM PHYSICAL EXAMINATION:    Constitutional: Well-nourished. No physical deformities. Normally developed. Good grooming.  Neck: Neck symmetrical, not swollen. Normal tracheal position.  Respiratory: No labored breathing, no use of accessory muscles.   Cardiovascular: Normal temperature, normal extremity pulses, no swelling, no varicosities.  Skin: No paleness, no jaundice, no cyanosis. No lesion, no ulcer, no rash.  Neurologic / Psychiatric: Oriented to time, oriented to place, oriented to person. No depression, no anxiety, no agitation.  Gastrointestinal: No mass, no tenderness, no rigidity, non obese abdomen.  Eyes: Normal conjunctivae. Normal eyelids.  Ears, Nose, Mouth, and Throat: Left ear no scars, no lesions, no masses. Right ear no scars, no lesions, no masses. Nose no scars, no lesions, no masses. Normal hearing. Normal lips.  Musculoskeletal: Normal gait and station of head and neck.     PAST DATA REVIEWED:  Source Of History:  Patient  Lab Test Review:   PSA  Records Review:   Pathology Reports, Previous Patient Records  Urine Test Review:   Urinalysis   04/01/16  PSA  Total PSA 5.90 ng/dl  Free PSA 0.45 ng/dl  % Free PSA 8 %    PROCEDURES:          Urinalysis Dipstick Dipstick Cont'd  Color: Yellow Bilirubin: Neg  Appearance: Clear Ketones: Neg  Specific Gravity: 1.010 Blood: Neg  pH: 7.5 Protein: Neg  Glucose: Neg Urobilinogen: 0.2    Nitrites: Neg    Leukocyte Esterase: Neg    ASSESSMENT:      ICD-10 Details  1 GU:   Prostate Cancer - C61    PLAN:           Schedule Return Visit/Planned Activity:  Keep Scheduled Appointment          Document Letter(s):  Created for Patient: Clinical Summary         Notes:   1. Prostate cancer: Mr. Herberger was seen with his wife today and does wish to proceed with surgical treatment of his prostate cancer. We discussed surgical therapy for prostate cancer including the different available surgical approaches. We discussed, in detail, the risks and expectations of surgery with regard to cancer control, urinary control, and erectile function as well as the expected postoperative recovery process. Additional risks of surgery including but not limited to bleeding, infection, hernia formation, nerve damage, lymphocele formation, bowel/rectal injury potentially necessitating colostomy, damage to the urinary tract resulting in urine leakage, urethral stricture, and the cardiopulmonary risks such as myocardial infarction, stroke, death, venothromboembolism, etc. were explained. The risk of open surgical conversion for robotic/laparoscopic prostatectomy was also discussed.   He is scheduled to undergo a bilateral nerve sparing (partial on the  right side) robot-assisted laparoscopic radical prostatectomy and bilateral pelvic lymphadenectomy. All questions were answered to his stated satisfaction today. He gives informed consent to proceed.   Cc: Dr. Carolann Littler      E & M CODE: I spent at least 40 minutes face to face with the patient, more than 50% of that time was spent on counseling and/or coordinating care.     * Signed by Raynelle Bring, M.D. on 06/04/16 at 9:00 PM (EDT)*

## 2016-06-24 ENCOUNTER — Ambulatory Visit (HOSPITAL_COMMUNITY): Payer: Medicare Other | Admitting: Registered Nurse

## 2016-06-24 ENCOUNTER — Encounter (HOSPITAL_COMMUNITY): Payer: Self-pay | Admitting: *Deleted

## 2016-06-24 ENCOUNTER — Observation Stay (HOSPITAL_COMMUNITY)
Admission: RE | Admit: 2016-06-24 | Discharge: 2016-06-25 | Disposition: A | Discharge status: Home / Self Care | Payer: Medicare Other | Source: Ambulatory Visit | Attending: Urology | Admitting: Urology

## 2016-06-24 ENCOUNTER — Encounter (HOSPITAL_COMMUNITY)
Admission: RE | Disposition: A | Discharge status: Home / Self Care | Payer: Self-pay | Source: Ambulatory Visit | Attending: Urology

## 2016-06-24 DIAGNOSIS — C61 Malignant neoplasm of prostate: Principal | ICD-10-CM | POA: Diagnosis present

## 2016-06-24 DIAGNOSIS — Z880 Allergy status to penicillin: Secondary | ICD-10-CM | POA: Insufficient documentation

## 2016-06-24 DIAGNOSIS — R1310 Dysphagia, unspecified: Secondary | ICD-10-CM | POA: Diagnosis not present

## 2016-06-24 DIAGNOSIS — R1900 Intra-abdominal and pelvic swelling, mass and lump, unspecified site: Secondary | ICD-10-CM | POA: Diagnosis not present

## 2016-06-24 DIAGNOSIS — Z7982 Long term (current) use of aspirin: Secondary | ICD-10-CM | POA: Diagnosis not present

## 2016-06-24 DIAGNOSIS — Z8042 Family history of malignant neoplasm of prostate: Secondary | ICD-10-CM | POA: Insufficient documentation

## 2016-06-24 DIAGNOSIS — R972 Elevated prostate specific antigen [PSA]: Secondary | ICD-10-CM | POA: Diagnosis not present

## 2016-06-24 HISTORY — PX: LYMPHADENECTOMY: SHX5960

## 2016-06-24 HISTORY — PX: ROBOT ASSISTED LAPAROSCOPIC RADICAL PROSTATECTOMY: SHX5141

## 2016-06-24 LAB — TYPE AND SCREEN
ABO/RH(D): A NEG
Antibody Screen: NEGATIVE

## 2016-06-24 LAB — HEMOGLOBIN AND HEMATOCRIT, BLOOD
HEMATOCRIT: 37.3 % — AB (ref 39.0–52.0)
Hemoglobin: 12.6 g/dL — ABNORMAL LOW (ref 13.0–17.0)

## 2016-06-24 SURGERY — XI ROBOTIC ASSISTED LAPAROSCOPIC RADICAL PROSTATECTOMY LEVEL 2
Anesthesia: General

## 2016-06-24 MED ORDER — PHENYLEPHRINE 40 MCG/ML (10ML) SYRINGE FOR IV PUSH (FOR BLOOD PRESSURE SUPPORT)
PREFILLED_SYRINGE | INTRAVENOUS | Status: DC | PRN
  Administered 2016-06-24: 40 ug via INTRAVENOUS

## 2016-06-24 MED ORDER — DEXAMETHASONE SODIUM PHOSPHATE 10 MG/ML IJ SOLN
INTRAMUSCULAR | Status: DC | PRN
  Administered 2016-06-24: 10 mg via INTRAVENOUS

## 2016-06-24 MED ORDER — ROCURONIUM BROMIDE 50 MG/5ML IV SOSY
PREFILLED_SYRINGE | INTRAVENOUS | Status: AC
  Filled 2016-06-24: qty 5

## 2016-06-24 MED ORDER — INDIGOTINDISULFONATE SODIUM 8 MG/ML IJ SOLN
INTRAMUSCULAR | Status: AC
  Filled 2016-06-24: qty 5

## 2016-06-24 MED ORDER — ONDANSETRON HCL 4 MG/2ML IJ SOLN
INTRAMUSCULAR | Status: AC
  Filled 2016-06-24: qty 2

## 2016-06-24 MED ORDER — PROPOFOL 10 MG/ML IV BOLUS
INTRAVENOUS | Status: DC | PRN
  Administered 2016-06-24: 130 mg via INTRAVENOUS

## 2016-06-24 MED ORDER — SULFAMETHOXAZOLE-TRIMETHOPRIM 800-160 MG PO TABS: 1.0000 | ORAL_TABLET | Freq: Two times a day (BID) | ORAL | 0 refills | Status: DC

## 2016-06-24 MED ORDER — MIDAZOLAM HCL 2 MG/2ML IJ SOLN
0.5000 mg | Freq: Once | INTRAMUSCULAR | Status: AC | PRN
  Administered 2016-06-24: 1 mg via INTRAVENOUS

## 2016-06-24 MED ORDER — PROMETHAZINE HCL 25 MG/ML IJ SOLN
6.2500 mg | INTRAMUSCULAR | Status: DC | PRN
  Administered 2016-06-24: 6.25 mg via INTRAVENOUS

## 2016-06-24 MED ORDER — MORPHINE SULFATE (PF) 2 MG/ML IV SOLN
2.0000 mg | INTRAVENOUS | Status: DC | PRN
  Filled 2016-06-24: qty 1

## 2016-06-24 MED ORDER — DEXAMETHASONE SODIUM PHOSPHATE 10 MG/ML IJ SOLN
INTRAMUSCULAR | Status: AC
  Filled 2016-06-24: qty 1

## 2016-06-24 MED ORDER — ROCURONIUM BROMIDE 10 MG/ML (PF) SYRINGE
PREFILLED_SYRINGE | INTRAVENOUS | Status: DC | PRN
  Administered 2016-06-24: 10 mg via INTRAVENOUS
  Administered 2016-06-24: 20 mg via INTRAVENOUS
  Administered 2016-06-24: 50 mg via INTRAVENOUS
  Administered 2016-06-24: 10 mg via INTRAVENOUS

## 2016-06-24 MED ORDER — DIPHENHYDRAMINE HCL 50 MG/ML IJ SOLN: 12.5000 mg | Freq: Four times a day (QID) | INTRAMUSCULAR | Status: DC | PRN

## 2016-06-24 MED ORDER — PHENYLEPHRINE 40 MCG/ML (10ML) SYRINGE FOR IV PUSH (FOR BLOOD PRESSURE SUPPORT)
PREFILLED_SYRINGE | INTRAVENOUS | Status: AC
  Filled 2016-06-24: qty 10

## 2016-06-24 MED ORDER — BUPIVACAINE-EPINEPHRINE (PF) 0.25% -1:200000 IJ SOLN
INTRAMUSCULAR | Status: AC
  Filled 2016-06-24: qty 30

## 2016-06-24 MED ORDER — LACTATED RINGERS IV SOLN
INTRAVENOUS | Status: DC
  Administered 2016-06-24: 10:00:00 via INTRAVENOUS

## 2016-06-24 MED ORDER — BUPIVACAINE-EPINEPHRINE 0.25% -1:200000 IJ SOLN
INTRAMUSCULAR | Status: DC | PRN
  Administered 2016-06-24: 26 mL

## 2016-06-24 MED ORDER — SODIUM CHLORIDE 0.9 % IV BOLUS (SEPSIS)
1000.0000 mL | Freq: Once | INTRAVENOUS | Status: AC
  Administered 2016-06-24: 1000 mL via INTRAVENOUS

## 2016-06-24 MED ORDER — HYDROCODONE-ACETAMINOPHEN 5-325 MG PO TABS: 1.0000 | ORAL_TABLET | Freq: Four times a day (QID) | ORAL | 0 refills | Status: DC | PRN

## 2016-06-24 MED ORDER — FLUTICASONE PROPIONATE 50 MCG/ACT NA SUSP
2.0000 | Freq: Every day | NASAL | Status: DC
  Filled 2016-06-24: qty 16

## 2016-06-24 MED ORDER — MIDAZOLAM HCL 5 MG/5ML IJ SOLN
INTRAMUSCULAR | Status: DC | PRN
  Administered 2016-06-24: 2 mg via INTRAVENOUS

## 2016-06-24 MED ORDER — INDIGOTINDISULFONATE SODIUM 8 MG/ML IJ SOLN
INTRAMUSCULAR | Status: DC | PRN
  Administered 2016-06-24: 5 mL via INTRAVENOUS

## 2016-06-24 MED ORDER — CEFAZOLIN SODIUM-DEXTROSE 1-4 GM/50ML-% IV SOLN
1.0000 g | Freq: Three times a day (TID) | INTRAVENOUS | Status: AC
  Administered 2016-06-24 (×2): 1 g via INTRAVENOUS
  Filled 2016-06-24 (×2): qty 50

## 2016-06-24 MED ORDER — SUGAMMADEX SODIUM 200 MG/2ML IV SOLN
INTRAVENOUS | Status: AC
  Filled 2016-06-24: qty 2

## 2016-06-24 MED ORDER — LIDOCAINE 2% (20 MG/ML) 5 ML SYRINGE
INTRAMUSCULAR | Status: AC
Start: 2016-06-24 — End: 2016-06-24
  Filled 2016-06-24: qty 5

## 2016-06-24 MED ORDER — LIDOCAINE 2% (20 MG/ML) 5 ML SYRINGE
INTRAMUSCULAR | Status: DC | PRN
  Administered 2016-06-24: 80 mg via INTRAVENOUS

## 2016-06-24 MED ORDER — SODIUM CHLORIDE 0.9 % IR SOLN
Status: DC | PRN
  Administered 2016-06-24: 1 via INTRAVESICAL

## 2016-06-24 MED ORDER — KETOROLAC TROMETHAMINE 15 MG/ML IJ SOLN
15.0000 mg | Freq: Four times a day (QID) | INTRAMUSCULAR | Status: DC
  Administered 2016-06-24 – 2016-06-25 (×3): 15 mg via INTRAVENOUS
  Filled 2016-06-24 (×3): qty 1

## 2016-06-24 MED ORDER — DOCUSATE SODIUM 100 MG PO CAPS
100.0000 mg | ORAL_CAPSULE | Freq: Two times a day (BID) | ORAL | Status: DC
  Administered 2016-06-24 – 2016-06-25 (×2): 100 mg via ORAL
  Filled 2016-06-24 (×2): qty 1

## 2016-06-24 MED ORDER — STERILE WATER FOR IRRIGATION IR SOLN
Status: DC | PRN
  Administered 2016-06-24: 1000 mL

## 2016-06-24 MED ORDER — KCL IN DEXTROSE-NACL 20-5-0.45 MEQ/L-%-% IV SOLN
INTRAVENOUS | Status: DC
  Administered 2016-06-24 (×2): via INTRAVENOUS
  Filled 2016-06-24 (×3): qty 1000

## 2016-06-24 MED ORDER — MORPHINE SULFATE (PF) 4 MG/ML IV SOLN: 2.0000 mg | INTRAVENOUS | Status: DC | PRN

## 2016-06-24 MED ORDER — ONDANSETRON HCL 4 MG/2ML IJ SOLN
INTRAMUSCULAR | Status: DC | PRN
  Administered 2016-06-24: 4 mg via INTRAVENOUS

## 2016-06-24 MED ORDER — FENTANYL CITRATE (PF) 250 MCG/5ML IJ SOLN
INTRAMUSCULAR | Status: DC | PRN
  Administered 2016-06-24: 250 ug via INTRAVENOUS
  Administered 2016-06-24 (×2): 50 ug via INTRAVENOUS

## 2016-06-24 MED ORDER — FENTANYL CITRATE (PF) 100 MCG/2ML IJ SOLN
INTRAMUSCULAR | Status: AC
  Filled 2016-06-24: qty 2

## 2016-06-24 MED ORDER — CEFAZOLIN SODIUM-DEXTROSE 2-4 GM/100ML-% IV SOLN
2.0000 g | INTRAVENOUS | Status: AC
  Administered 2016-06-24: 2 g via INTRAVENOUS
  Filled 2016-06-24: qty 100

## 2016-06-24 MED ORDER — SUGAMMADEX SODIUM 200 MG/2ML IV SOLN
INTRAVENOUS | Status: DC | PRN
  Administered 2016-06-24: 200 mg via INTRAVENOUS

## 2016-06-24 MED ORDER — LACTATED RINGERS IV SOLN
INTRAVENOUS | Status: DC | PRN
  Administered 2016-06-24: 200 mL

## 2016-06-24 MED ORDER — HEPARIN SODIUM (PORCINE) 1000 UNIT/ML IJ SOLN
INTRAMUSCULAR | Status: AC
  Filled 2016-06-24: qty 1

## 2016-06-24 MED ORDER — MEPERIDINE HCL 50 MG/ML IJ SOLN: 6.2500 mg | INTRAMUSCULAR | Status: DC | PRN

## 2016-06-24 MED ORDER — MIDAZOLAM HCL 2 MG/2ML IJ SOLN
INTRAMUSCULAR | Status: AC
  Filled 2016-06-24: qty 2

## 2016-06-24 MED ORDER — DIPHENHYDRAMINE HCL 12.5 MG/5ML PO ELIX
12.5000 mg | ORAL_SOLUTION | Freq: Four times a day (QID) | ORAL | Status: DC | PRN
  Administered 2016-06-25: 12.5 mg via ORAL
  Filled 2016-06-24: qty 5

## 2016-06-24 MED ORDER — HYDROMORPHONE HCL 1 MG/ML IJ SOLN
INTRAMUSCULAR | Status: AC
  Filled 2016-06-24: qty 2

## 2016-06-24 MED ORDER — FENTANYL CITRATE (PF) 250 MCG/5ML IJ SOLN
INTRAMUSCULAR | Status: AC
  Filled 2016-06-24: qty 5

## 2016-06-24 MED ORDER — PROMETHAZINE HCL 25 MG/ML IJ SOLN
INTRAMUSCULAR | Status: AC
  Filled 2016-06-24: qty 1

## 2016-06-24 MED ORDER — HYDROMORPHONE HCL 1 MG/ML IJ SOLN
0.2500 mg | INTRAMUSCULAR | Status: DC | PRN
  Administered 2016-06-24 (×4): 0.5 mg via INTRAVENOUS

## 2016-06-24 MED ORDER — ACETAMINOPHEN 325 MG PO TABS: 650.0000 mg | ORAL_TABLET | ORAL | Status: DC | PRN

## 2016-06-24 MED ORDER — PROPOFOL 10 MG/ML IV BOLUS
INTRAVENOUS | Status: AC
  Filled 2016-06-24: qty 20

## 2016-06-24 SURGICAL SUPPLY — 54 items
APPLICATOR COTTON TIP 6IN STRL (MISCELLANEOUS) ×4 IMPLANT
CATH FOLEY 2WAY SLVR 18FR 30CC (CATHETERS) ×4 IMPLANT
CATH ROBINSON RED A/P 16FR (CATHETERS) ×4 IMPLANT
CATH ROBINSON RED A/P 8FR (CATHETERS) ×4 IMPLANT
CATH TIEMANN FOLEY 18FR 5CC (CATHETERS) ×4 IMPLANT
CHLORAPREP W/TINT 26ML (MISCELLANEOUS) ×4 IMPLANT
CLIP LIGATING HEM O LOK PURPLE (MISCELLANEOUS) ×8 IMPLANT
COVER SURGICAL LIGHT HANDLE (MISCELLANEOUS) ×4 IMPLANT
COVER TIP SHEARS 8 DVNC (MISCELLANEOUS) ×2 IMPLANT
COVER TIP SHEARS 8MM DA VINCI (MISCELLANEOUS) ×2
CUTTER ECHEON FLEX ENDO 45 340 (ENDOMECHANICALS) ×4 IMPLANT
DECANTER SPIKE VIAL GLASS SM (MISCELLANEOUS) IMPLANT
DERMABOND ADVANCED (GAUZE/BANDAGES/DRESSINGS) ×2
DERMABOND ADVANCED .7 DNX12 (GAUZE/BANDAGES/DRESSINGS) ×2 IMPLANT
DRAPE ARM DVNC X/XI (DISPOSABLE) ×8 IMPLANT
DRAPE COLUMN DVNC XI (DISPOSABLE) ×2 IMPLANT
DRAPE DA VINCI XI ARM (DISPOSABLE) ×8
DRAPE DA VINCI XI COLUMN (DISPOSABLE) ×2
DRAPE SURG IRRIG POUCH 19X23 (DRAPES) ×4 IMPLANT
DRSG TEGADERM 4X4.75 (GAUZE/BANDAGES/DRESSINGS) ×4 IMPLANT
ELECT REM PT RETURN 15FT ADLT (MISCELLANEOUS) ×4 IMPLANT
GLOVE BIO SURGEON STRL SZ 6.5 (GLOVE) ×3 IMPLANT
GLOVE BIO SURGEONS STRL SZ 6.5 (GLOVE) ×1
GLOVE BIOGEL M STRL SZ7.5 (GLOVE) ×8 IMPLANT
GOWN STRL REUS W/TWL LRG LVL3 (GOWN DISPOSABLE) ×12 IMPLANT
HOLDER FOLEY CATH W/STRAP (MISCELLANEOUS) ×4 IMPLANT
IRRIG SUCT STRYKERFLOW 2 WTIP (MISCELLANEOUS) ×4
IRRIGATION SUCT STRKRFLW 2 WTP (MISCELLANEOUS) ×2 IMPLANT
NDL SAFETY ECLIPSE 18X1.5 (NEEDLE) ×2 IMPLANT
NEEDLE HYPO 18GX1.5 SHARP (NEEDLE) ×2
PACK ROBOT UROLOGY CUSTOM (CUSTOM PROCEDURE TRAY) ×4 IMPLANT
SEAL CANN UNIV 5-8 DVNC XI (MISCELLANEOUS) ×8 IMPLANT
SEAL XI 5MM-8MM UNIVERSAL (MISCELLANEOUS) ×8
SOLUTION ELECTROLUBE (MISCELLANEOUS) ×4 IMPLANT
STAPLE RELOAD 45 GRN (STAPLE) ×2 IMPLANT
STAPLE RELOAD 45MM GREEN (STAPLE) ×2
SUT ETHILON 3 0 PS 1 (SUTURE) ×4 IMPLANT
SUT MNCRL 3 0 RB1 (SUTURE) ×2 IMPLANT
SUT MNCRL 3 0 VIOLET RB1 (SUTURE) ×2 IMPLANT
SUT MNCRL AB 4-0 PS2 18 (SUTURE) ×8 IMPLANT
SUT MONOCRYL 3 0 RB1 (SUTURE) ×4
SUT VIC AB 0 CT1 27 (SUTURE) ×2
SUT VIC AB 0 CT1 27XBRD ANTBC (SUTURE) ×2 IMPLANT
SUT VIC AB 0 UR5 27 (SUTURE) ×4 IMPLANT
SUT VIC AB 2-0 SH 27 (SUTURE) ×2
SUT VIC AB 2-0 SH 27X BRD (SUTURE) ×2 IMPLANT
SUT VIC AB 3-0 SH 27 (SUTURE) ×4
SUT VIC AB 3-0 SH 27X BRD (SUTURE) ×2 IMPLANT
SUT VIC AB 3-0 SH 27XBRD (SUTURE) ×2 IMPLANT
SUT VICRYL 0 UR6 27IN ABS (SUTURE) ×8 IMPLANT
SYR 27GX1/2 1ML LL SAFETY (SYRINGE) ×4 IMPLANT
TOWEL OR 17X26 10 PK STRL BLUE (TOWEL DISPOSABLE) ×4 IMPLANT
TOWEL OR NON WOVEN STRL DISP B (DISPOSABLE) ×4 IMPLANT
TUBING INSUFFLATION 10FT LAP (TUBING) IMPLANT

## 2016-06-24 NOTE — Anesthesia Postprocedure Evaluation (Signed)
Anesthesia Post Note  Patient: Chris Stone  Procedure(s) Performed: Procedure(s) (LRB): XI ROBOTIC ASSISTED LAPAROSCOPIC RADICAL PROSTATECTOMY LEVEL 2 (N/A) PELVIC LYMPHADENECTOMY (Bilateral)     Patient location during evaluation: PACU Anesthesia Type: General Level of consciousness: awake and alert Pain control: pain improving. Vital Signs Assessment: post-procedure vital signs reviewed and stable Respiratory status: spontaneous breathing, nonlabored ventilation, respiratory function stable and patient connected to nasal cannula oxygen Cardiovascular status: blood pressure returned to baseline and stable Postop Assessment: no signs of nausea or vomiting Anesthetic complications: no    Last Vitals:  Vitals:   06/24/16 1552 06/24/16 1851  BP: (!) 133/58 (!) 121/57  Pulse: 84 64  Resp: 12 16  Temp: 36.7 C 36.4 C    Last Pain:  Vitals:   06/24/16 1851  TempSrc: Oral  PainSc:                  Pasty Manninen,E. Weylyn Ricciuti

## 2016-06-24 NOTE — Anesthesia Preprocedure Evaluation (Addendum)
Anesthesia Evaluation  Patient identified by MRN, date of birth, ID band Patient awake    Reviewed: Allergy & Precautions, NPO status , Patient's Chart, lab work & pertinent test results  History of Anesthesia Complications Negative for: history of anesthetic complications  Airway Mallampati: I  TM Distance: >3 FB Neck ROM: Full    Dental  (+) Dental Advisory Given   Pulmonary neg pulmonary ROS,    breath sounds clear to auscultation       Cardiovascular (-) angina+ dysrhythmias (benign PVCs)  Rhythm:Regular Rate:Normal  '17 ECHO: EF 55-60%, mild MR, mild TR   Neuro/Psych negative neurological ROS     GI/Hepatic negative GI ROS, Neg liver ROS,   Endo/Other  negative endocrine ROS  Renal/GU negative Renal ROS   Prostate cancer    Musculoskeletal negative musculoskeletal ROS (+)   Abdominal   Peds  Hematology negative hematology ROS (+)   Anesthesia Other Findings   Reproductive/Obstetrics                            Anesthesia Physical Anesthesia Plan  ASA: II  Anesthesia Plan: General   Post-op Pain Management:    Induction: Intravenous  PONV Risk Score and Plan: 3 and Ondansetron, Dexamethasone, Propofol and Midazolam  Airway Management Planned: Oral ETT  Additional Equipment:   Intra-op Plan:   Post-operative Plan: Extubation in OR  Informed Consent: I have reviewed the patients History and Physical, chart, labs and discussed the procedure including the risks, benefits and alternatives for the proposed anesthesia with the patient or authorized representative who has indicated his/her understanding and acceptance.   Dental advisory given  Plan Discussed with: CRNA and Surgeon  Anesthesia Plan Comments: (Plan routine monitors, GETA)        Anesthesia Quick Evaluation

## 2016-06-24 NOTE — Interval H&P Note (Signed)
History and Physical Interval Note:  06/24/2016 10:20 AM  Chris Stone  has presented today for surgery, with the diagnosis of PROSTATE CANCER  The various methods of treatment have been discussed with the patient and family. After consideration of risks, benefits and other options for treatment, the patient has consented to  Procedure(s): XI ROBOTIC ASSISTED LAPAROSCOPIC RADICAL PROSTATECTOMY LEVEL 2 (N/A) PELVIC LYMPHADENECTOMY (Bilateral) as a surgical intervention .  The patient's history has been reviewed, patient examined, no change in status, stable for surgery.  I have reviewed the patient's chart and labs.  Questions were answered to the patient's satisfaction.     Julizza Sassone,LES

## 2016-06-24 NOTE — Anesthesia Procedure Notes (Signed)
Procedure Name: Intubation Date/Time: 06/24/2016 11:16 AM Performed by: Talbot Grumbling Pre-anesthesia Checklist: Patient identified, Emergency Drugs available, Suction available and Patient being monitored Patient Re-evaluated:Patient Re-evaluated prior to inductionOxygen Delivery Method: Circle system utilized Preoxygenation: Pre-oxygenation with 100% oxygen Intubation Type: IV induction Ventilation: Mask ventilation without difficulty Laryngoscope Size: Mac and 3 Grade View: Grade II Tube type: Oral Tube size: 8.0 mm Number of attempts: 1 Airway Equipment and Method: Stylet Placement Confirmation: ETT inserted through vocal cords under direct vision,  positive ETCO2 and breath sounds checked- equal and bilateral Secured at: 23 cm Tube secured with: Tape Dental Injury: Teeth and Oropharynx as per pre-operative assessment

## 2016-06-24 NOTE — Discharge Instructions (Signed)

## 2016-06-24 NOTE — Op Note (Signed)
Preoperative diagnosis: Clinically localized adenocarcinoma of the prostate (clinical stage T2a Nx Mx)  Postoperative diagnosis: Clinically localized adenocarcinoma of the prostate (clinical stage T2a Nx Mx )  Procedure:  1. Robotic assisted laparoscopic radical prostatectomy (bilateral nerve sparing) 2. Bilateral robotic assisted laparoscopic pelvic lymphadenectomy  Surgeon: Pryor Curia. M.D.  Assistant: Debbrah Alar, PA-C  An assistant was required for this surgical procedure.  The duties of the assistant included but were not limited to suctioning, passing suture, camera manipulation, retraction. This procedure would not be able to be performed without an Environmental consultant.  Anesthesia: General  Complications: None  EBL: 100 mL  IVF:  1400 mL crystalloid  Specimens: 1. Prostate and seminal vesicles 2. Right pelvic lymph nodes 3. Left pelvic lymph nodes  Disposition of specimens: Pathology  Drains: 1. 20 Fr coude catheter 2. # 19 Blake pelvic drain  Indication: Chris Stone is a 66 y.o. year old patient with clinically localized prostate cancer.  After a thorough review of the management options for treatment of prostate cancer, he elected to proceed with surgical therapy and the above procedure(s).  We have discussed the potential benefits and risks of the procedure, side effects of the proposed treatment, the likelihood of the patient achieving the goals of the procedure, and any potential problems that might occur during the procedure or recuperation. Informed consent has been obtained.  Description of procedure:  The patient was taken to the operating room and a general anesthetic was administered. He was given preoperative antibiotics, placed in the dorsal lithotomy position, and prepped and draped in the usual sterile fashion. Next a preoperative timeout was performed. A urethral catheter was placed into the bladder and a site was selected near the umbilicus for  placement of the camera port. This was placed using a standard open Hassan technique which allowed entry into the peritoneal cavity under direct vision and without difficulty. An 8 mm robotic port was placed and a pneumoperitoneum established. The camera was then used to inspect the abdomen and there was no evidence of any intra-abdominal injuries or other abnormalities. The remaining abdominal ports were then placed. 8 mm robotic ports were placed in the right lower quadrant, left lower quadrant, and far left lateral abdominal wall. A 5 mm port was placed in the right upper quadrant and a 12 mm port was placed in the right lateral abdominal wall for laparoscopic assistance. All ports were placed under direct vision without difficulty. The surgical cart was then docked.   Utilizing the cautery scissors, the bladder was reflected posteriorly allowing entry into the space of Retzius and identification of the endopelvic fascia and prostate. The periprostatic fat was then removed from the prostate allowing full exposure of the endopelvic fascia. The endopelvic fascia was then incised from the apex back to the base of the prostate bilaterally and the underlying levator muscle fibers were swept laterally off the prostate thereby isolating the dorsal venous complex. The dorsal vein was then stapled and divided with a 45 mm Flex Echelon stapler. Attention then turned to the bladder neck which was divided anteriorly thereby allowing entry into the bladder and exposure of the urethral catheter.  The catheter balloon was deflated and the catheter was brought into the operative field and used to retract the prostate anteriorly. The posterior bladder neck was then examined and was divided allowing further dissection between the bladder and prostate posteriorly until the vasa deferentia and seminal vessels were identified. During this dissection there was noted to  be an opening in the posterior bladder which was subsequently  repaired with a 3-0 Vicryl stitch.  Indigo carmine was administered to ensure the ureteral orifices were identified and away from this repair. The vasa deferentia were isolated, divided, and lifted anteriorly. The seminal vesicles were dissected down to their tips with care to control the seminal vascular arterial blood supply. These structures were then lifted anteriorly and the space between Denonvillier's fascia and the anterior rectum was developed with a combination of sharp and blunt dissection. This isolated the vascular pedicles of the prostate.  The lateral prostatic fascia was then sharply incised allowing release of the neurovascular bundles bilaterally. The vascular pedicles of the prostate were then ligated with Weck clips between the prostate and neurovascular bundles and divided with sharp cold scissor dissection resulting in neurovascular bundle preservation. The neurovascular bundles were then separated off the apex of the prostate and urethra bilaterally.  The urethra was then sharply transected allowing the prostate specimen to be disarticulated. The pelvis was copiously irrigated and hemostasis was ensured. There was no evidence for rectal injury.  Attention then turned to the right pelvic sidewall. The fibrofatty tissue between the external iliac vein, confluence of the iliac vessels, hypogastric artery, and Cooper's ligament was dissected free from the pelvic sidewall with care to preserve the obturator nerve. Weck clips were used for lymphostasis and hemostasis. An identical procedure was performed on the contralateral side and the lymphatic packets were removed for permanent pathologic analysis.  Attention then turned to the urethral anastomosis. A 2-0 Vicryl slip knot was placed between Denonvillier's fascia, the posterior bladder neck, and the posterior urethra to reapproximate these structures. A double-armed 3-0 Monocryl suture was then used to perform a 360 running tension-free  anastomosis between the bladder neck and urethra. A new urethral catheter was then placed into the bladder and irrigated. There were no blood clots within the bladder and the anastomosis appeared to be watertight. A #19 Blake drain was then brought through the left lateral 8 mm port site and positioned appropriately within the pelvis. It was secured to the skin with a nylon suture. The surgical cart was then undocked. The right lateral 12 mm port site was closed at the fascial level with a 0 Vicryl suture placed laparoscopically. All remaining ports were then removed under direct vision. The prostate specimen was removed intact within the Endopouch retrieval bag via the periumbilical camera port site. This fascial opening was closed with two running 0 Vicryl sutures. 0.25% Marcaine was then injected into all port sites and all incisions were reapproximated at the skin level with 4-0 Monocryl subcuticular sutures and Liquiband. The patient appeared to tolerate the procedure well and without complications. The patient was able to be extubated and transferred to the recovery unit in satisfactory condition.   Pryor Curia MD

## 2016-06-24 NOTE — Transfer of Care (Signed)
Immediate Anesthesia Transfer of Care Note  Patient: Chris Stone  Procedure(s) Performed: Procedure(s): XI ROBOTIC ASSISTED LAPAROSCOPIC RADICAL PROSTATECTOMY LEVEL 2 (N/A) PELVIC LYMPHADENECTOMY (Bilateral)  Patient Location: PACU  Anesthesia Type:General  Level of Consciousness:  sedated, patient cooperative and responds to stimulation  Airway & Oxygen Therapy:Patient Spontanous Breathing and Patient connected to face mask oxgen  Post-op Assessment:  Report given to PACU RN and Post -op Vital signs reviewed and stable  Post vital signs:  Reviewed and stable  Last Vitals:  Vitals:   06/24/16 0916  BP: 113/60  Pulse: 65  Resp: 16  Temp: 19.7 C    Complications: No apparent anesthesia complications

## 2016-06-25 DIAGNOSIS — Z880 Allergy status to penicillin: Secondary | ICD-10-CM | POA: Diagnosis not present

## 2016-06-25 DIAGNOSIS — Z7982 Long term (current) use of aspirin: Secondary | ICD-10-CM | POA: Diagnosis not present

## 2016-06-25 DIAGNOSIS — C61 Malignant neoplasm of prostate: Secondary | ICD-10-CM | POA: Diagnosis not present

## 2016-06-25 DIAGNOSIS — Z8042 Family history of malignant neoplasm of prostate: Secondary | ICD-10-CM | POA: Diagnosis not present

## 2016-06-25 LAB — HEMOGLOBIN AND HEMATOCRIT, BLOOD
HEMATOCRIT: 35.1 % — AB (ref 39.0–52.0)
Hemoglobin: 12 g/dL — ABNORMAL LOW (ref 13.0–17.0)

## 2016-06-25 MED ORDER — BISACODYL 10 MG RE SUPP
10.0000 mg | Freq: Once | RECTAL | Status: AC
  Administered 2016-06-25: 10 mg via RECTAL
  Filled 2016-06-25: qty 1

## 2016-06-25 MED ORDER — HYDROCODONE-ACETAMINOPHEN 5-325 MG PO TABS
1.0000 | ORAL_TABLET | Freq: Four times a day (QID) | ORAL | Status: DC | PRN
  Administered 2016-06-25: 1 via ORAL
  Filled 2016-06-25: qty 1

## 2016-06-25 NOTE — Progress Notes (Signed)
06/25/16  1330  Reviewed discharge instructions with patient. Patient verbalized understanding of discharge instructions. Copy of discharge instructions and prescriptions given to patient. Educated pt of how to change foley bag to leg bag. Leg bag applied. Gave patient gauze and tape to change Lt side dressing.

## 2016-06-25 NOTE — Progress Notes (Signed)
Patient ID: Chris Stone, male   DOB: Jul 12, 1950, 66 y.o.   MRN: [id number]  1 Day Post-Op Subjective: The patient is doing well.  No nausea or vomiting. Pain is adequately controlled.  Objective: Vital signs in last 24 hours: Temp:  [97.5 F (36.4 C)-99.1 F (37.3 C)] 98 F (36.7 C) (06/12 0648) Pulse Rate:  [62-84] 62 (06/12 0648) Resp:  [11-16] 16 (06/12 0648) BP: (111-133)/(48-81) 111/59 (06/12 0648) SpO2:  [95 %-100 %] 98 % (06/12 0648) Weight:  [67.4 kg (148 lb 9.4 oz)-68 kg (150 lb)] 67.4 kg (148 lb 9.4 oz) (06/11 1600)  Intake/Output from previous day: 06/11 0701 - 06/12 0700 In: 3705 [P.O.:120; I.V.:2535; IV Piggyback:1050] Out: 2885 [Urine:2725; Drains:60; Blood:100] Intake/Output this shift: No intake/output data recorded.  Physical Exam:  General: Alert and oriented. CV: RRR Lungs: Clear bilaterally. GI: Soft, Nondistended. Incisions: Clean, dry, and intact Urine: Clear Extremities: Nontender, no erythema, no edema.  Lab Results:  Recent Labs  06/24/16 1516 06/25/16 0547  HGB 12.6* 12.0*  HCT 37.3* 35.1*      Assessment/Plan: POD# 1 s/p robotic prostatectomy.  1) SL IVF 2) Ambulate, Incentive spirometry 3) Transition to oral pain medication 4) Dulcolax suppository 5) D/C pelvic drain 6) Plan for likely discharge later today   Pryor Curia. MD   LOS: 0 days   Kaytlen Lightsey,LES 06/25/2016, 8:10 AM

## 2016-06-25 NOTE — Discharge Summary (Signed)
  Date of admission: 06/24/2016  Date of discharge: 06/25/2016  Admission diagnosis: Prostate Cancer  Discharge diagnosis: Prostate Cancer  History and Physical: For full details, please see admission history and physical. Briefly, Chris Stone is a 66 y.o. gentleman with localized prostate cancer.  After discussing management/treatment options, he elected to proceed with surgical treatment.  Hospital Course: Chris Stone was taken to the operating room on 06/24/2016 and underwent a robotic assisted laparoscopic radical prostatectomy. He tolerated this procedure well and without complications. Postoperatively, he was able to be transferred to a regular hospital room following recovery from anesthesia.  He was able to begin ambulating the night of surgery. He remained hemodynamically stable overnight.  He had excellent urine output with appropriately minimal output from his pelvic drain and his pelvic drain was removed on POD #1.  He was transitioned to oral pain medication, tolerated a clear liquid diet, and had met all discharge criteria and was able to be discharged home later on POD#1.  Laboratory values:  Recent Labs  06/24/16 1516 06/25/16 0547  HGB 12.6* 12.0*  HCT 37.3* 35.1*    Disposition: Home  Discharge instruction: He was instructed to be ambulatory but to refrain from heavy lifting, strenuous activity, or driving. He was instructed on urethral catheter care.  Discharge medications:  Allergies as of 06/25/2016      Reactions   Penicillins Rash   Has patient had a PCN reaction causing immediate rash, facial/tongue/throat swelling, SOB or lightheadedness with hypotension:unknown Has patient had a PCN reaction causing severe rash involving mucus membranes or skin necrosis:unknown Has patient had a PCN reaction that required hospitalization:NO Has patient had a PCN reaction occurring within the last 10 years:NO CHILDHOOD REACTION; RASH If all of the above answers are  "NO", then may proceed with Cephalosporin use.      Medication List    STOP taking these medications   aspirin 325 MG tablet   ibuprofen 200 MG tablet Commonly known as:  ADVIL,MOTRIN     TAKE these medications   FLONASE SENSIMIST 27.5 MCG/SPRAY nasal spray Generic drug:  fluticasone Place 2 sprays into the nose daily.   HYDROcodone-acetaminophen 5-325 MG tablet Commonly known as:  NORCO Take 1-2 tablets by mouth every 6 (six) hours as needed for moderate pain or severe pain.   sulfamethoxazole-trimethoprim 800-160 MG tablet Commonly known as:  BACTRIM DS,SEPTRA DS Take 1 tablet by mouth 2 (two) times daily. Start the day prior to foley removal appointment       Followup: He will followup in 1 week for catheter removal and to discuss his surgical pathology results.

## 2016-07-08 ENCOUNTER — Encounter: Payer: Self-pay | Admitting: Family Medicine

## 2016-07-22 DIAGNOSIS — M6281 Muscle weakness (generalized): Secondary | ICD-10-CM | POA: Diagnosis not present

## 2016-07-22 DIAGNOSIS — N393 Stress incontinence (female) (male): Secondary | ICD-10-CM | POA: Diagnosis not present

## 2016-07-25 DIAGNOSIS — Z85828 Personal history of other malignant neoplasm of skin: Secondary | ICD-10-CM | POA: Diagnosis not present

## 2016-07-25 DIAGNOSIS — L570 Actinic keratosis: Secondary | ICD-10-CM | POA: Diagnosis not present

## 2016-08-07 DIAGNOSIS — N393 Stress incontinence (female) (male): Secondary | ICD-10-CM | POA: Diagnosis not present

## 2016-08-07 DIAGNOSIS — M6281 Muscle weakness (generalized): Secondary | ICD-10-CM | POA: Diagnosis not present

## 2016-09-17 DIAGNOSIS — C61 Malignant neoplasm of prostate: Secondary | ICD-10-CM | POA: Diagnosis not present

## 2016-09-25 DIAGNOSIS — N5231 Erectile dysfunction following radical prostatectomy: Secondary | ICD-10-CM | POA: Diagnosis not present

## 2016-09-25 DIAGNOSIS — C61 Malignant neoplasm of prostate: Secondary | ICD-10-CM | POA: Diagnosis not present

## 2016-09-25 DIAGNOSIS — N393 Stress incontinence (female) (male): Secondary | ICD-10-CM | POA: Diagnosis not present

## 2016-10-03 ENCOUNTER — Encounter: Payer: Self-pay | Admitting: Family Medicine

## 2016-10-21 DIAGNOSIS — Z85828 Personal history of other malignant neoplasm of skin: Secondary | ICD-10-CM | POA: Diagnosis not present

## 2016-10-21 DIAGNOSIS — D485 Neoplasm of uncertain behavior of skin: Secondary | ICD-10-CM | POA: Diagnosis not present

## 2016-10-21 DIAGNOSIS — D1801 Hemangioma of skin and subcutaneous tissue: Secondary | ICD-10-CM | POA: Diagnosis not present

## 2016-10-21 DIAGNOSIS — D045 Carcinoma in situ of skin of trunk: Secondary | ICD-10-CM | POA: Diagnosis not present

## 2016-10-21 DIAGNOSIS — L570 Actinic keratosis: Secondary | ICD-10-CM | POA: Diagnosis not present

## 2016-10-21 DIAGNOSIS — L821 Other seborrheic keratosis: Secondary | ICD-10-CM | POA: Diagnosis not present

## 2016-11-28 DIAGNOSIS — R310 Gross hematuria: Secondary | ICD-10-CM | POA: Diagnosis not present

## 2016-11-28 DIAGNOSIS — C61 Malignant neoplasm of prostate: Secondary | ICD-10-CM | POA: Diagnosis not present

## 2016-12-12 DIAGNOSIS — J029 Acute pharyngitis, unspecified: Secondary | ICD-10-CM | POA: Diagnosis not present

## 2016-12-12 DIAGNOSIS — R070 Pain in throat: Secondary | ICD-10-CM | POA: Diagnosis not present

## 2017-03-10 DIAGNOSIS — J22 Unspecified acute lower respiratory infection: Secondary | ICD-10-CM | POA: Diagnosis not present

## 2017-03-10 DIAGNOSIS — R509 Fever, unspecified: Secondary | ICD-10-CM | POA: Diagnosis not present

## 2017-03-10 DIAGNOSIS — R05 Cough: Secondary | ICD-10-CM | POA: Diagnosis not present

## 2017-03-25 DIAGNOSIS — C61 Malignant neoplasm of prostate: Secondary | ICD-10-CM | POA: Diagnosis not present

## 2017-04-02 DIAGNOSIS — N5231 Erectile dysfunction following radical prostatectomy: Secondary | ICD-10-CM | POA: Diagnosis not present

## 2017-04-02 DIAGNOSIS — C61 Malignant neoplasm of prostate: Secondary | ICD-10-CM | POA: Diagnosis not present

## 2017-04-06 IMAGING — MR MR CARD MORPHOLOGY WO/W CM
9 of 10 series · 15 of 16 positions shown · IV contrast (multihance)
Comparison: none

CLINICAL DATA: Abnormal Myovue

EXAM:
CARDIAC MRI
TECHNIQUE: The patient was scanned on a 1.5 Tesla GE magnet. A dedicated
cardiac coil was used. Functional imaging was done using Fiesta
sequences. [DATE], and 4 chamber views were done to assess for RWMA's.
Modified Dubois rule using a short axis stack was used to
calculate an ejection fraction on a dedicated work station using
Circle software. The patient received 20 cc of Multihance. After 10
minutes inversion recovery sequences were used to assess for
infiltration and scar tissue.
CONTRAST:  20 cc Multihance

[Series 3: bSSFP · sagittal · 8.0mm · 1.29mm/px · 1 of 14 slices shown (1 of 6)]
[im 1/14]
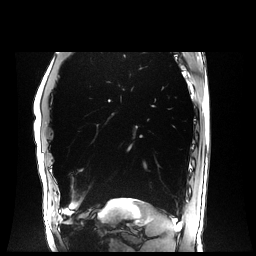

[Series 5: bSSFP · oblique · 8.0mm · 1.33mm/px · 1 of 20 slices shown (2 of 6)]
[im 1/20]
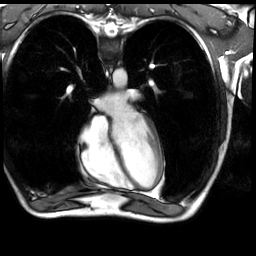

[Series 6: bSSFP · oblique · 8.0mm · 1.37mm/px · 7 of 400 slices shown (3 of 6)]
[im 1/400]
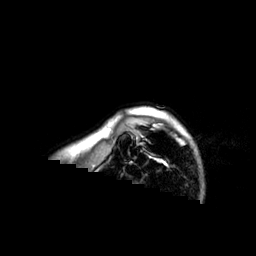
[im 67/400]
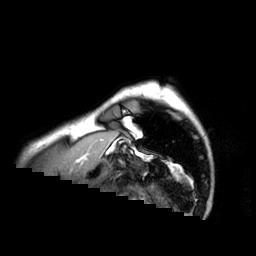
[im 134/400]
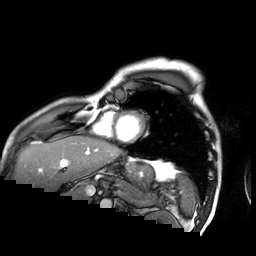
[im 200/400]
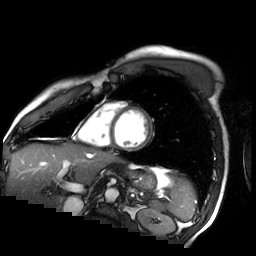
[im 267/400]
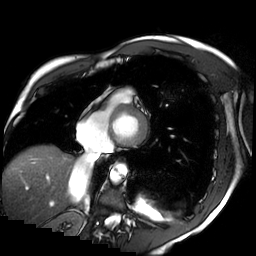
[im 333/400]
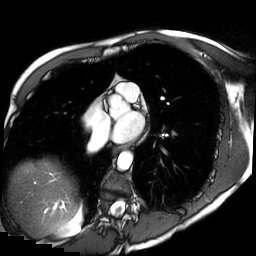
[im 400/400]
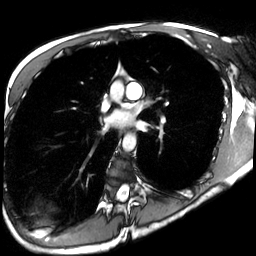

[Series 8: bSSFP · oblique · 8.0mm · 1.41mm/px · 1 of 20 slices shown (4 of 6)]
[im 1/20]
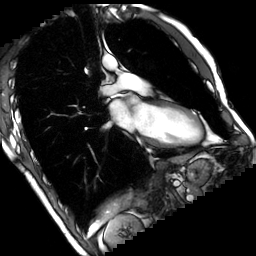

[Series 10: bSSFP · sagittal · 8.0mm · 1.41mm/px · 1 of 20 slices shown (5 of 6)]
[im 1/20]
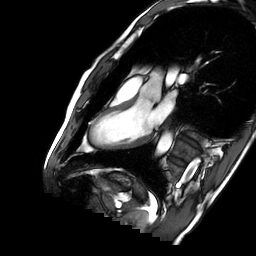

[Series 12: bSSFP · oblique · 8.0mm · 1.33mm/px · 1 of 20 slices shown (6 of 6)]
[im 1/20]
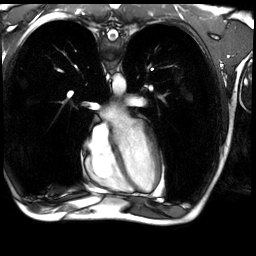

[Series 14: cine ir · oblique · 8.0mm · 1.37mm/px · 1 of 30 slices shown]
[im 1/30]
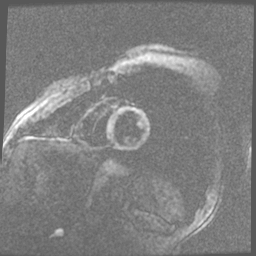

[Series 19: delayed ir prep · oblique · 8.0mm · 1.37mm/px · 1 of 13 slices shown (1 of 2)]
[im 1/13]
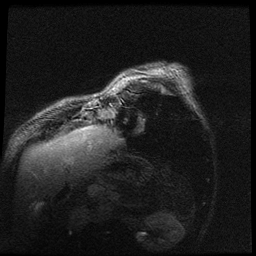

[Series 20: delayed ir prep · oblique · 8.0mm · 1.37mm/px · 1 of 8 slices shown (2 of 2)]
[im 1/8]
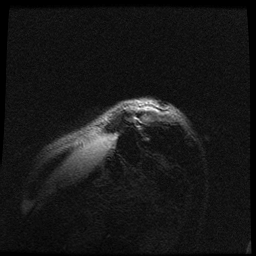

[15 of 16 positions shown; findings below may reference images not displayed]

FINDINGS: All 4 cardiac chambers were normal in size and function. There was
no ASD/VSD. There was no pericardial effusion. The aortic, mitral
and tricuspid valves were normal. The LV was normal with

A quantitative EF of 76% (EDV 137 cc ESV 33 cc sV 104 cc) There were
no RWMA;s. Delayed gadolinium inversion recovery sequences showed no
scar infarct or infiltration
IMPRESSION: 1) Normal LV size and function EF 76%

2) No scar or infarct seen on delayed inversion recovery sequences
post gadolinium. In particular there were no inferior or lateral
wall scars as suggested on myovue

3) Normal cardiac MRI

Roseliane Tashiro

## 2017-10-22 DIAGNOSIS — C61 Malignant neoplasm of prostate: Secondary | ICD-10-CM | POA: Diagnosis not present

## 2017-10-22 DIAGNOSIS — N5231 Erectile dysfunction following radical prostatectomy: Secondary | ICD-10-CM | POA: Diagnosis not present

## 2017-11-03 DIAGNOSIS — L821 Other seborrheic keratosis: Secondary | ICD-10-CM | POA: Diagnosis not present

## 2017-11-03 DIAGNOSIS — Z85828 Personal history of other malignant neoplasm of skin: Secondary | ICD-10-CM | POA: Diagnosis not present

## 2017-11-03 DIAGNOSIS — L570 Actinic keratosis: Secondary | ICD-10-CM | POA: Diagnosis not present

## 2018-03-19 IMAGING — DX DG CHEST 2V
2 series · 2 of 2 positions shown · non-contrast
Comparison: None.

CLINICAL DATA: Preop prostate cancer

EXAM:
CHEST  2 VIEW

[chest pa]
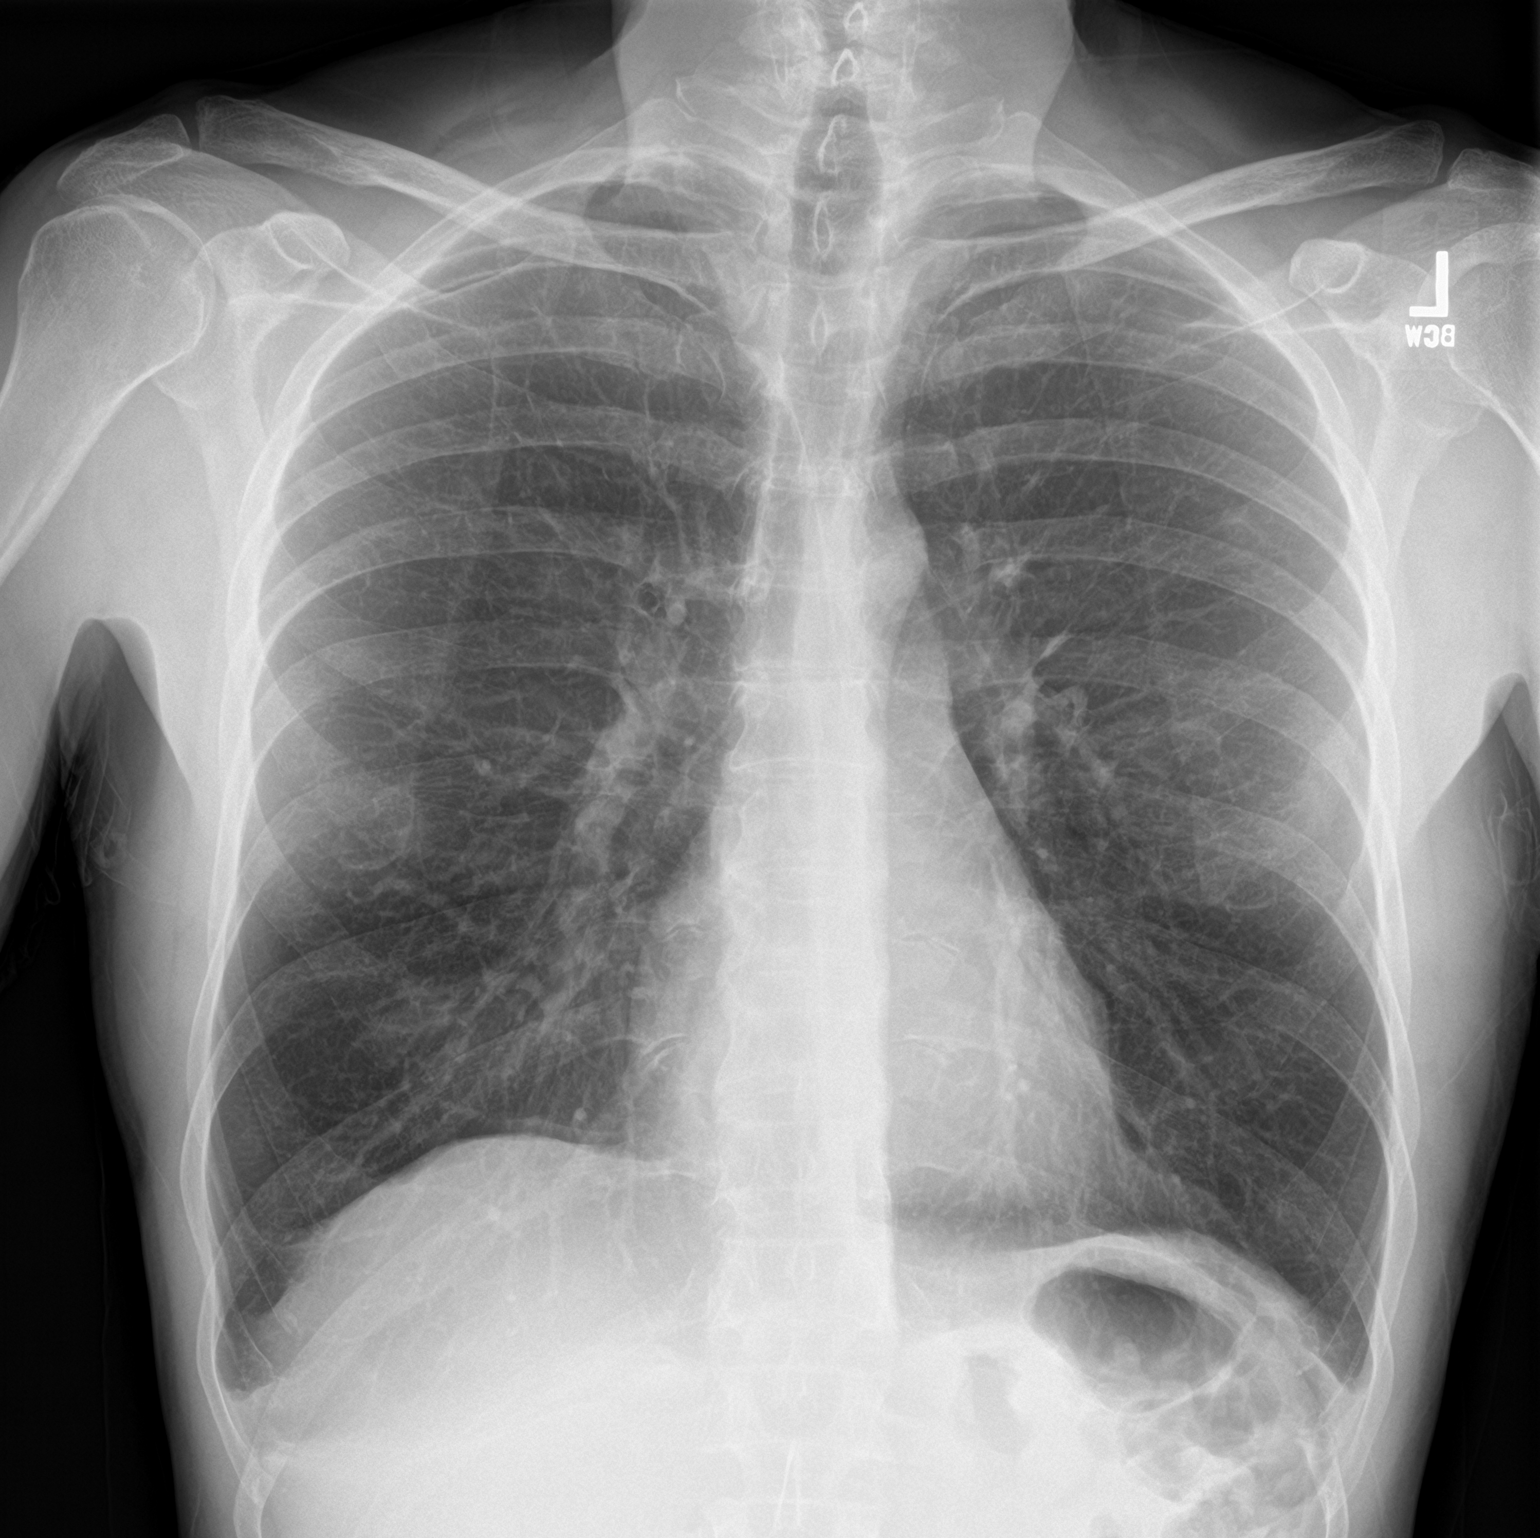

[chest lat]
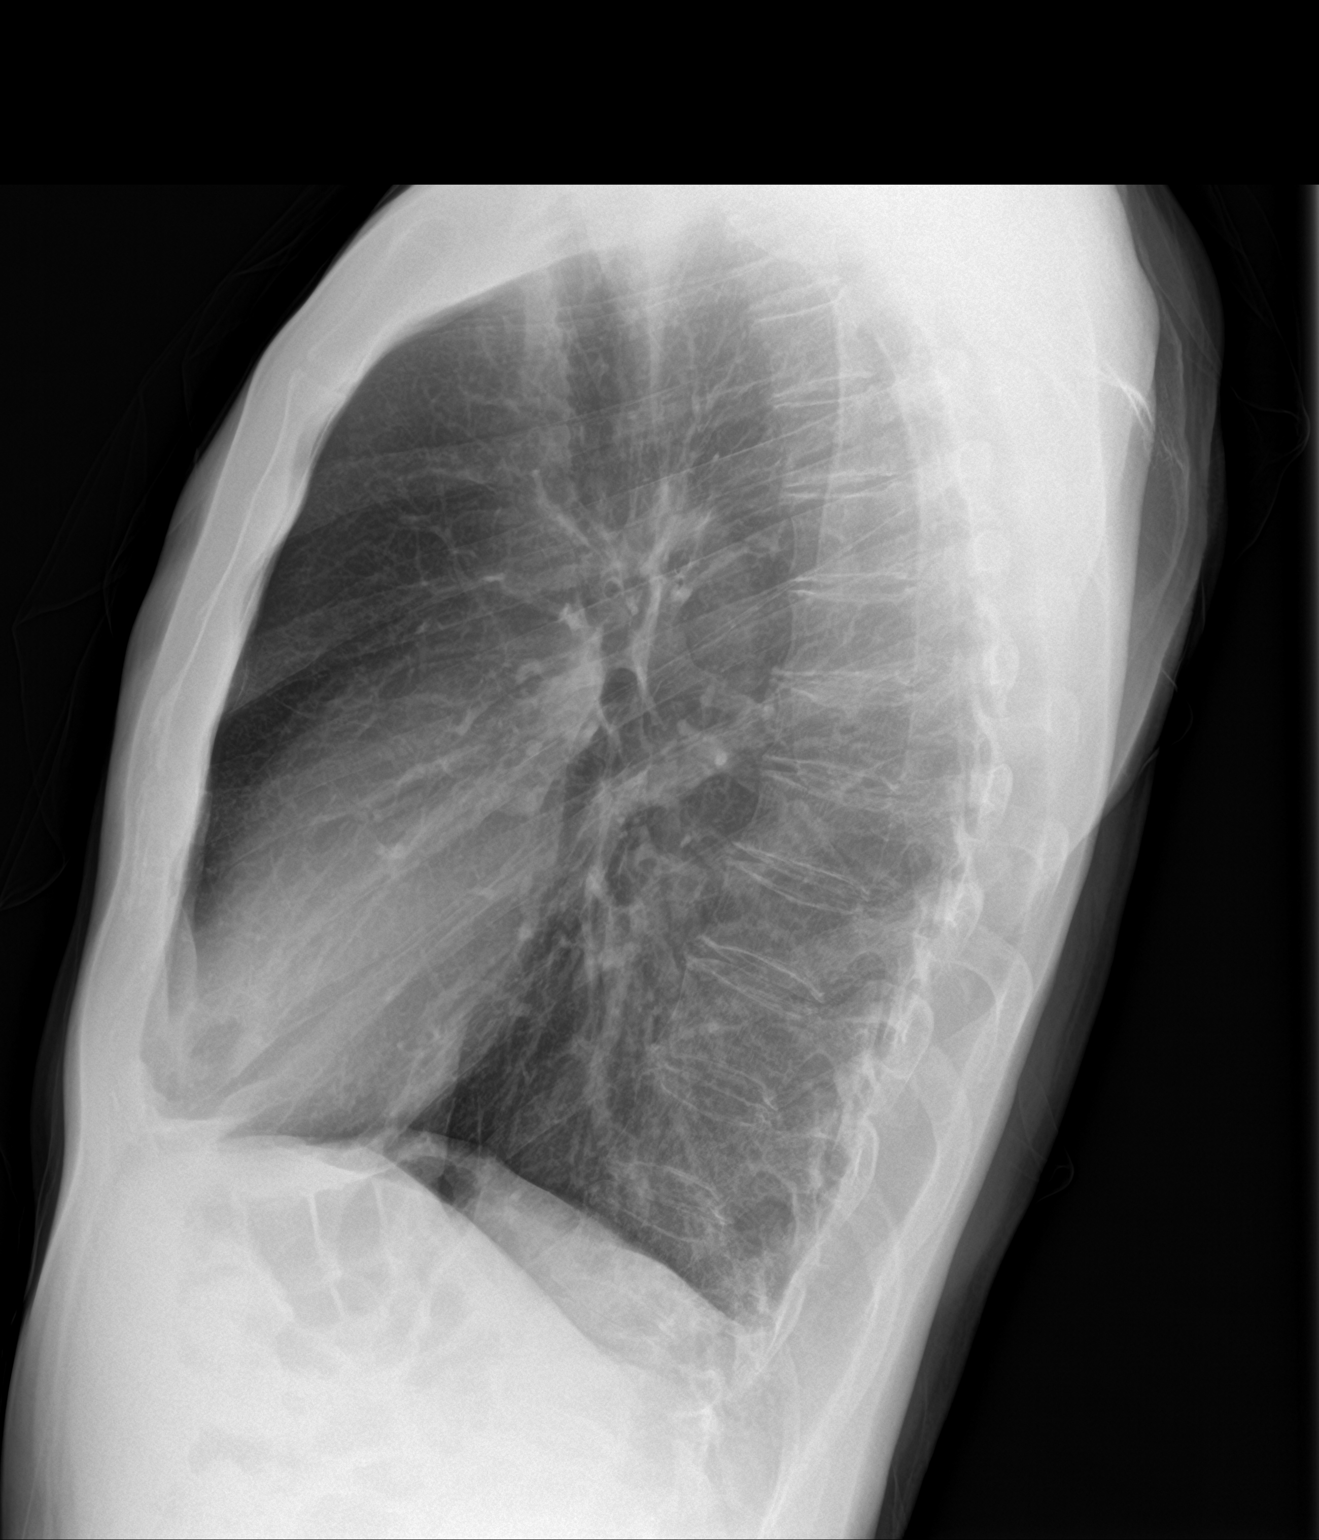

[2 of 2 positions shown; findings below may reference images not displayed]

FINDINGS: There is hyperinflation of the lungs compatible with COPD. Heart and
mediastinal contours are within normal limits. No focal opacities or
effusions. No acute bony abnormality.
IMPRESSION: COPD.  No active disease.

## 2018-07-31 DIAGNOSIS — C61 Malignant neoplasm of prostate: Secondary | ICD-10-CM | POA: Diagnosis not present

## 2018-09-17 ENCOUNTER — Encounter: Payer: Self-pay | Admitting: Family Medicine

## 2018-09-17 ENCOUNTER — Other Ambulatory Visit: Payer: Self-pay

## 2018-09-17 ENCOUNTER — Telehealth (INDEPENDENT_AMBULATORY_CARE_PROVIDER_SITE_OTHER): Payer: Medicare Other | Admitting: Family Medicine

## 2018-09-17 DIAGNOSIS — J029 Acute pharyngitis, unspecified: Secondary | ICD-10-CM

## 2018-09-17 NOTE — Progress Notes (Signed)
Virtual Visit via Video Note  I connected with the patient on 09/17/18 at  9:00 AM EDT by a video enabled telemedicine application and verified that I am speaking with the correct person using two identifiers.  Location patient: home Location provider:work or home office Persons participating in the virtual visit: patient, provider  I discussed the limitations of evaluation and management by telemedicine and the availability of in person appointments. The patient expressed understanding and agreed to proceed.   HPI: Here to ask about a very mild ST he has had for the past 16 days. He has felt fine otherwise, and he has ridden his bicycle daily as usual. No headache or sinus congestion, no PND or fever. No cough or SOB. No NVD. He has been taking Zyrtec daily. His wife has also developed a mild ST over the past week. He is living near [place], [place], and he is scheduled to get a Covid-19 virus test at the local [facility] tomorrow morning.    ROS: See pertinent positives and negatives per HPI.  Past Medical History:  Diagnosis Date  . ABDOMINAL MASS 07/21/2008  . ALLERGIC RHINITIS 05/09/2008  . Basal cell carcinoma 2012?   Upper Lip  . Complication of anesthesia    Weakness  . Microscopic hematuria 05/09/2008  . Prostate cancer ([place])   . Squamous cell carcinoma    nose    Past Surgical History:  Procedure Laterality Date  . KNEE ARTHROSCOPY W/ ACL RECONSTRUCTION    . LYMPHADENECTOMY Bilateral 06/24/2016   Procedure: PELVIC LYMPHADENECTOMY;  Surgeon: Raynelle Bring, MD;  Location: [facility];  Service: Urology;  Laterality: Bilateral;  . PROSTATE BIOPSY    . ROBOT ASSISTED LAPAROSCOPIC RADICAL PROSTATECTOMY N/A 06/24/2016   Procedure: XI ROBOTIC ASSISTED LAPAROSCOPIC RADICAL PROSTATECTOMY LEVEL 2;  Surgeon: Raynelle Bring, MD;  Location: [facility];  Service: Urology;  Laterality: N/A;  . TONSILLECTOMY    . TONSILLECTOMY      Family History  Problem Relation Age of Onset  . Cancer Father 64      prostate  . Hypertension Father   . Stroke Father   . Arthritis Neg Hx        family     Current Outpatient Medications:  .  fluticasone (FLONASE SENSIMIST) 27.5 MCG/SPRAY nasal spray, Place 2 sprays into the nose daily., Disp: , Rfl:  .  HYDROcodone-acetaminophen (NORCO) 5-325 MG tablet, Take 1-2 tablets by mouth every 6 (six) hours as needed for moderate pain or severe pain., Disp: 30 tablet, Rfl: 0 .  sildenafil (REVATIO) 20 MG tablet, TAKE 1-5 TABLET BY MOUTH AS NEEDED AND AS DIRECTED, Disp: , Rfl:  .  sulfamethoxazole-trimethoprim (BACTRIM DS,SEPTRA DS) 800-160 MG tablet, Take 1 tablet by mouth 2 (two) times daily. Start the day prior to foley removal appointment, Disp: 6 tablet, Rfl: 0  EXAM:  VITALS per patient if applicable:  GENERAL: alert, oriented, appears well and in no acute distress  HEENT: atraumatic, conjunttiva clear, no obvious abnormalities on inspection of external nose and ears  NECK: normal movements of the head and neck  LUNGS: on inspection no signs of respiratory distress, breathing rate appears normal, no obvious gross SOB, gasping or wheezing  CV: no obvious cyanosis  MS: moves all visible extremities without noticeable abnormality  PSYCH/NEURO: pleasant and cooperative, no obvious depression or anxiety, speech and thought processing grossly intact  ASSESSMENT AND PLAN: 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 I am  glad he is getting a Covid test, and I also advised his wife to be tested. He can use Chloraseptic lozenges and warm salt water gargles as needed.  Alysia Penna, MD  Discussed the following assessment and plan:  No diagnosis found.     I discussed the assessment and treatment plan with the patient. The patient was provided an opportunity to ask questions and all were answered. The patient agreed with the plan and demonstrated an understanding of the instructions.   The patient was advised to call  back or seek an in-person evaluation if the symptoms worsen or if the condition fails to improve as anticipated.

## 2018-09-18 DIAGNOSIS — Z20828 Contact with and (suspected) exposure to other viral communicable diseases: Secondary | ICD-10-CM | POA: Diagnosis not present

## 2018-10-11 DIAGNOSIS — Z20828 Contact with and (suspected) exposure to other viral communicable diseases: Secondary | ICD-10-CM | POA: Diagnosis not present

## 2018-11-09 DIAGNOSIS — Z85828 Personal history of other malignant neoplasm of skin: Secondary | ICD-10-CM | POA: Diagnosis not present

## 2018-11-09 DIAGNOSIS — L820 Inflamed seborrheic keratosis: Secondary | ICD-10-CM | POA: Diagnosis not present

## 2018-11-09 DIAGNOSIS — L570 Actinic keratosis: Secondary | ICD-10-CM | POA: Diagnosis not present

## 2018-11-09 DIAGNOSIS — L821 Other seborrheic keratosis: Secondary | ICD-10-CM | POA: Diagnosis not present

## 2019-02-03 ENCOUNTER — Ambulatory Visit: Payer: Medicare Other | Attending: Family Medicine

## 2019-02-03 DIAGNOSIS — Z23 Encounter for immunization: Secondary | ICD-10-CM | POA: Insufficient documentation

## 2019-02-03 NOTE — Progress Notes (Signed)
   Covid-19 Vaccination Clinic  Name:  Chris Stone    MRN: [id number] DOB: [birth year]  02/03/2019  [name] was observed post Covid-19 immunization for 15 minutes without incidence. He was provided with Vaccine Information Sheet and instruction to access the V-Safe system.   [name] was instructed to call 911 with any severe reactions post vaccine: [name] Kitchen Difficulty breathing  . Swelling of your face and throat  . A fast heartbeat  . A bad rash all over your body  . Dizziness and weakness    Immunizations Administered    Name Date Dose VIS Date Route   Pfizer COVID-19 Vaccine 02/03/2019  4:06 PM 0.3 mL 12/25/2018 Intramuscular   Manufacturer: [place]   Lot: [id number]   [place]: [id number]

## 2019-02-11 ENCOUNTER — Ambulatory Visit: Payer: Medicare Other

## 2019-02-22 ENCOUNTER — Ambulatory Visit: Payer: Medicare Other

## 2019-02-23 ENCOUNTER — Ambulatory Visit: Payer: Medicare Other

## 2019-02-24 ENCOUNTER — Ambulatory Visit: Payer: Medicare Other | Attending: Internal Medicine

## 2019-02-24 DIAGNOSIS — Z23 Encounter for immunization: Secondary | ICD-10-CM | POA: Insufficient documentation

## 2019-02-24 NOTE — Progress Notes (Signed)
   Covid-19 Vaccination Clinic  Name:  Chris Stone    MRN: [id number] DOB: 1950-05-02  02/24/2019  Mr. Chris Stone was observed post Covid-19 immunization for 15 minutes without incidence. He was provided with Vaccine Information Sheet and instruction to access the V-Safe system.   Mr. Chris Stone was instructed to call 911 with any severe reactions post vaccine: [name] Kitchen Difficulty breathing  . Swelling of your face and throat  . A fast heartbeat  . A bad rash all over your body  . Dizziness and weakness    Immunizations Administered    Name Date Dose VIS Date Route   Pfizer COVID-19 Vaccine 02/24/2019  1:10 PM 0.3 mL 12/25/2018 Intramuscular   Manufacturer: [place]   Lot: [id number]   [place]: [id number]

## 2020-02-21 ENCOUNTER — Telehealth: Payer: Self-pay | Admitting: Family Medicine

## 2020-02-21 NOTE — Telephone Encounter (Signed)
Left message for patient to call back and schedule Medicare Annual Wellness Visit (AWV) either virtually or in office. No detailed message left   Last AWV no information  please schedule at anytime with [facility] Nurse Health Advisor 1 or 2   This should be a 45 minute visit. Patient also needs appointment with pcp last appointment virtual 09/17/2018

## 2020-04-17 ENCOUNTER — Encounter: Payer: Medicare Other | Admitting: Family Medicine

## 2020-04-25 ENCOUNTER — Encounter: Payer: Medicare Other | Admitting: Family Medicine

## 2020-05-02 ENCOUNTER — Ambulatory Visit (INDEPENDENT_AMBULATORY_CARE_PROVIDER_SITE_OTHER): Payer: Medicare Other | Admitting: Family Medicine

## 2020-05-02 ENCOUNTER — Other Ambulatory Visit: Payer: Self-pay

## 2020-05-02 ENCOUNTER — Encounter: Payer: Self-pay | Admitting: Family Medicine

## 2020-05-02 VITALS — BP 124/72 | HR 57 | Temp 97.9°F | Ht 70.0 in | Wt 150.0 lb

## 2020-05-02 DIAGNOSIS — Z23 Encounter for immunization: Secondary | ICD-10-CM | POA: Diagnosis not present

## 2020-05-02 DIAGNOSIS — Z1159 Encounter for screening for other viral diseases: Secondary | ICD-10-CM | POA: Diagnosis not present

## 2020-05-02 DIAGNOSIS — C61 Malignant neoplasm of prostate: Secondary | ICD-10-CM

## 2020-05-02 DIAGNOSIS — Z1211 Encounter for screening for malignant neoplasm of colon: Secondary | ICD-10-CM

## 2020-05-02 DIAGNOSIS — K429 Umbilical hernia without obstruction or gangrene: Secondary | ICD-10-CM | POA: Diagnosis not present

## 2020-05-02 DIAGNOSIS — B001 Herpesviral vesicular dermatitis: Secondary | ICD-10-CM | POA: Diagnosis not present

## 2020-05-02 LAB — PSA: PSA: 0 ng/mL — ABNORMAL LOW (ref 0.10–4.00)

## 2020-05-02 MED ORDER — VALACYCLOVIR HCL 1 G PO TABS: ORAL_TABLET | ORAL | 1 refills | Status: AC

## 2020-05-02 NOTE — Patient Instructions (Signed)
Preventive Care 69 Years and Older, Male Preventive care refers to lifestyle choices and visits with your health care provider that can promote health and wellness. This includes:  A yearly physical exam. This is also called an annual wellness visit.  Regular dental and eye exams.  Immunizations.  Screening for certain conditions.  Healthy lifestyle choices, such as: ? Eating a healthy diet. ? Getting regular exercise. ? Not using drugs or products that contain nicotine and tobacco. ? Limiting alcohol use. What can I expect for my preventive care visit? Physical exam Your health care provider will check your:  Height and weight. These may be used to calculate your BMI (body mass index). BMI is a measurement that tells if you are at a healthy weight.  Heart rate and blood pressure.  Body temperature.  Skin for abnormal spots. Counseling Your health care provider may ask you questions about your:  Past medical problems.  Family's medical history.  Alcohol, tobacco, and drug use.  Emotional well-being.  Home life and relationship well-being.  Sexual activity.  Diet, exercise, and sleep habits.  History of falls.  Memory and ability to understand (cognition).  Work and work environment.  Access to firearms. What immunizations do I need? Vaccines are usually given at various ages, according to a schedule. Your health care provider will recommend vaccines for you based on your age, medical history, and lifestyle or other factors, such as travel or where you work.   What tests do I need? Blood tests  Lipid and cholesterol levels. These may be checked every 5 years, or more often depending on your overall health.  Hepatitis C test.  Hepatitis B test. Screening  Lung cancer screening. You may have this screening every year starting at age 55 if you have a 30-pack-year history of smoking and currently smoke or have quit within the past 15 years.  Colorectal  cancer screening. ? All adults should have this screening starting at age 50 and continuing until age 75. ? Your health care provider may recommend screening at age 45 if you are at increased risk. ? You will have tests every 1-10 years, depending on your results and the type of screening test.  Prostate cancer screening. Recommendations will vary depending on your family history and other risks.  Genital exam to check for testicular cancer or hernias.  Diabetes screening. ? This is done by checking your blood sugar (glucose) after you have not eaten for a while (fasting). ? You may have this done every 1-3 years.  Abdominal aortic aneurysm (AAA) screening. You may need this if you are a current or former smoker.  STD (sexually transmitted disease) testing, if you are at risk. Follow these instructions at home: Eating and drinking  Eat a diet that includes fresh fruits and vegetables, whole grains, lean protein, and low-fat dairy products. Limit your intake of foods with high amounts of sugar, saturated fats, and salt.  Take vitamin and mineral supplements as recommended by your health care provider.  Do not drink alcohol if your health care provider tells you not to drink.  If you drink alcohol: ? Limit how much you have to 0-2 drinks a day. ? Be aware of how much alcohol is in your drink. In the [place] one drink equals one 12 oz bottle of beer (355 mL), one 5 oz glass of wine (148 mL), or one 1 oz glass of hard liquor (44 mL).   Lifestyle  Take daily care of your teeth   and gums. Brush your teeth every morning and night with fluoride toothpaste. Floss one time each day.  Stay active. Exercise for at least 30 minutes 5 or more days each week.  Do not use any products that contain nicotine or tobacco, such as cigarettes, e-cigarettes, and chewing tobacco. If you need help quitting, ask your health care provider.  Do not use drugs.  If you are sexually active, practice safe sex.  Use a condom or other form of protection to prevent STIs (sexually transmitted infections).  Talk with your health care provider about taking a low-dose aspirin or statin.  Find healthy ways to cope with stress, such as: ? Meditation, yoga, or listening to music. ? Journaling. ? Talking to a trusted person. ? Spending time with friends and family. Safety  Always wear your seat belt while driving or riding in a vehicle.  Do not drive: ? If you have been drinking alcohol. Do not ride with someone who has been drinking. ? When you are tired or distracted. ? While texting.  Wear a helmet and other protective equipment during sports activities.  If you have firearms in your house, make sure you follow all gun safety procedures. What's next?  Visit your health care provider once a year for an annual wellness visit.  Ask your health care provider how often you should have your eyes and teeth checked.  Stay up to date on all vaccines. This information is not intended to replace advice given to you by your health care provider. Make sure you discuss any questions you have with your health care provider. Document Revised: 09/29/2018 Document Reviewed: 12/25/2017 Elsevier Patient Education  2021 Elsevier Inc.  

## 2020-05-02 NOTE — Progress Notes (Signed)
Established Patient Office Visit  Subjective:  Patient ID: Chris Stone, male    DOB: Apr 25, 1950  Age: 70 y.o. MRN: [id number]  CC:  Chief Complaint  Patient presents with  . Annual Exam    Check hernia, fever blister x 2 weeks, wants PSA included in labs    HPI Chris Stone presents for medical follow-up.  He and his wife spend most of her time up at [facility] in [place] but he also has a home here.  He has other family here.  Generally fairly healthy.  He does have history of prostate cancer with surgery back in 2018.  He did not follow-up with his urologist this year and needs follow-up PSA.  No prior history of fever blisters.  He just returned from [place], down there developed a blister on his left lower lip consistent with likely fever blister.  No other skin lesions noted.  This appears to be healing.  No intraoral lesions.  Umbilical hernia which has been noted previously.  Nonpainful.  First noted after he had surgery for his prostate.  Stays very active with cycling about 5000 miles per year and has no problems with things like lifting  History of hyperlipidemia but overall fairly low risk for CAD.  No recent chest pains.  Health maintenance reviewed: No history of Pneumovax or Prevnar.  No history of hepatitis C screening but low risk.  Needs follow-up PSA.  No history of Shingrix.  Overdue for follow-up colonoscopy  Past Medical History:  Diagnosis Date  . ABDOMINAL MASS 07/21/2008  . ALLERGIC RHINITIS 05/09/2008  . Basal cell carcinoma 2012?   Upper Lip  . Complication of anesthesia    Weakness  . Microscopic hematuria 05/09/2008  . Prostate cancer ([place])   . Squamous cell carcinoma    nose    Past Surgical History:  Procedure Laterality Date  . KNEE ARTHROSCOPY W/ ACL RECONSTRUCTION    . LYMPHADENECTOMY Bilateral 06/24/2016   Procedure: PELVIC LYMPHADENECTOMY;  Surgeon: Raynelle Bring, MD;  Location: [facility];  Service: Urology;  Laterality:  Bilateral;  . PROSTATE BIOPSY    . ROBOT ASSISTED LAPAROSCOPIC RADICAL PROSTATECTOMY N/A 06/24/2016   Procedure: XI ROBOTIC ASSISTED LAPAROSCOPIC RADICAL PROSTATECTOMY LEVEL 2;  Surgeon: Raynelle Bring, MD;  Location: [facility];  Service: Urology;  Laterality: N/A;  . TONSILLECTOMY    . TONSILLECTOMY      Family History  Problem Relation Age of Onset  . Cancer Father 20       prostate  . Hypertension Father   . Stroke Father   . Arthritis Neg Hx        family    Social History   Socioeconomic History  . Marital status: Married    Spouse name: Not on file  . Number of children: Not on file  . Years of education: Not on file  . Highest education level: Not on file  Occupational History  . Not on file  Tobacco Use  . Smoking status: Never Smoker  . Smokeless tobacco: Never Used  Vaping Use  . Vaping Use: Never used  Substance and Sexual Activity  . Alcohol use: Yes    Alcohol/week: 0.0 standard drinks    Comment: occ  . Drug use: No  . Sexual activity: Not on file  Other Topics Concern  . Not on file  Social History Narrative  . Not on file   Social Determinants of Health   Financial Resource Strain: Not on file  Food Insecurity: Not on file  Transportation Needs: Not on file  Physical Activity: Not on file  Stress: Not on file  Social Connections: Not on file  Intimate Partner Violence: Not on file    Outpatient Medications Prior to Visit  Medication Sig Dispense Refill  . HYDROcodone-acetaminophen (NORCO) 5-325 MG tablet Take 1-2 tablets by mouth every 6 (six) hours as needed for moderate pain or severe pain. 30 tablet 0  . sildenafil (REVATIO) 20 MG tablet TAKE 1-5 TABLET BY MOUTH AS NEEDED AND AS DIRECTED     No facility-administered medications prior to visit.    Allergies  Allergen Reactions  . Penicillins Rash    Has patient had a PCN reaction causing immediate rash, facial/tongue/throat swelling, SOB or lightheadedness with hypotension:unknown Has  patient had a PCN reaction causing severe rash involving mucus membranes or skin necrosis:unknown Has patient had a PCN reaction that required hospitalization:NO Has patient had a PCN reaction occurring within the last 10 years:NO CHILDHOOD REACTION; RASH If all of the above answers are "NO", then may proceed with Cephalosporin use.     ROS Review of Systems  Constitutional: Negative for appetite change, fatigue and unexpected weight change.  Eyes: Negative for visual disturbance.  Respiratory: Negative for cough, chest tightness and shortness of breath.   Cardiovascular: Negative for chest pain, palpitations and leg swelling.  Gastrointestinal: Negative for abdominal pain, blood in stool, constipation and diarrhea.  Neurological: Negative for dizziness, syncope, weakness, light-headedness and headaches.      Objective:    Physical Exam Vitals reviewed.  Constitutional:      Appearance: Normal appearance.  HENT:     Mouth/Throat:     Comments: Small healing area left lower lip.  No nodular features.  Small amount of exudate with this is healing.  No atypical features. Cardiovascular:     Rate and Rhythm: Normal rate and regular rhythm.  Pulmonary:     Effort: Pulmonary effort is normal.     Breath sounds: Normal breath sounds.  Abdominal:     Comments: Small nontender umbilical hernia.  Musculoskeletal:     Cervical back: Neck supple.  Lymphadenopathy:     Cervical: No cervical adenopathy.  Neurological:     Mental Status: He is alert.     BP 124/72 (BP Location: Left Arm, Patient Position: Sitting, Cuff Size: Normal)   Pulse (!) 57   Temp 97.9 F (36.6 C) (Oral)   Ht 5\' 10"  (1.778 m)   Wt 150 lb (68 kg)   SpO2 98%   BMI 21.52 kg/m  Wt Readings from Last 3 Encounters:  05/02/20 150 lb (68 kg)  06/24/16 148 lb 9.4 oz (67.4 kg)  06/17/16 150 lb 6 oz (68.2 kg)     Health Maintenance Due  Topic Date Due  . Hepatitis C Screening  Never done  . COLONOSCOPY  (Pts 45-64yrs Insurance coverage will need to be confirmed)  02/03/2017    There are no preventive care reminders to display for this patient.  Lab Results  Component Value Date   TSH 3.22 01/23/2016   Lab Results  Component Value Date   WBC 4.6 06/17/2016   HGB 12.0 (L) 06/25/2016   HCT 35.1 (L) 06/25/2016   MCV 93.4 06/17/2016   PLT 252 06/17/2016   Lab Results  Component Value Date   NA 139 06/17/2016   K 4.0 06/17/2016   CO2 26 06/17/2016   GLUCOSE 97 06/17/2016   BUN 20 06/17/2016   CREATININE  0.84 06/17/2016   BILITOT 1.8 (H) 01/23/2016   ALKPHOS 55 01/23/2016   AST 25 01/23/2016   ALT 35 01/23/2016   PROT 6.8 01/23/2016   ALBUMIN 4.3 01/23/2016   CALCIUM 8.6 (L) 06/17/2016   ANIONGAP 6 06/17/2016   GFR 81.60 01/23/2016   Lab Results  Component Value Date   CHOL 240 (H) 01/23/2016   Lab Results  Component Value Date   HDL 73.40 01/23/2016   Lab Results  Component Value Date   LDLCALC 147 (H) 01/23/2016   Lab Results  Component Value Date   TRIG 97.0 01/23/2016   Lab Results  Component Value Date   CHOLHDL 3 01/23/2016   No results found for: HGBA1C    Assessment & Plan:   #1 small umbilical hernia.  Asymptomatic. -Reassurance and we recommend observation for now. -Can set up surgical referral but he declines at this time.  Reviewed signs and symptoms of strangulation.  #2 cold sore left lower lip.  No atypical features. -Prescription for Valtrex 1000 mg take 2 at onset of cold sore and repeat 2 in 12 hours. -Be in touch if this is not fully 100% healed in 2 weeks  #3 history of prostate cancer -Recheck PSA  #4 health maintenance -Check hepatitis C antibody -Prevnar 13 given and consider Pneumovax in 1 year -Set up repeat colonoscopy -Discussed Shingrix and he will check at pharmacy if interested  Meds ordered this encounter  Medications  . valACYclovir (VALTREX) 1000 MG tablet    Sig: Take two tablets at onset of cold sore and  repeat two in 12 hours    Dispense:  30 tablet    Refill:  1    Follow-up: No follow-ups on file.    Carolann Littler, MD

## 2020-05-03 LAB — HEPATITIS C ANTIBODY
Hepatitis C Ab: NONREACTIVE
SIGNAL TO CUT-OFF: 0.01 (ref <1.00)

## 2020-06-14 ENCOUNTER — Telehealth: Payer: Self-pay | Admitting: Family Medicine

## 2020-06-14 NOTE — Telephone Encounter (Signed)
Left message for patient to call back and schedule Medicare Annual Wellness Visit (AWV) either virtually or in office.    AWV-I PER PALMETTO 01/14/17  please schedule at anytime with [facility] Nurse Health Advisor 1 or 2   This should be a 45 minute visit.

## 2020-06-15 NOTE — Telephone Encounter (Signed)
Patient had his AWV with Dr. Elease Hashimoto on 02/21/2020

## 2020-07-27 ENCOUNTER — Telehealth: Payer: Self-pay | Admitting: Family Medicine

## 2020-07-27 NOTE — Telephone Encounter (Signed)
Left message for patient to call back and schedule Medicare Annual Wellness Visit (AWV) either virtually or in office.    AWV-I PER PALMETTO 01/14/17 ; please schedule at anytime with [facility] Nurse Health Advisor 1 or 2   This should be a 45 minute visit.

## 2021-01-22 ENCOUNTER — Telehealth: Payer: Self-pay | Admitting: Family Medicine

## 2021-01-22 NOTE — Telephone Encounter (Signed)
Left message for patient to call back and schedule Medicare Annual Wellness Visit (AWV) either virtually or in office. Left  my Herbie Drape number [phone]   AWV-I PER PALMETTO 01/14/17- please schedule at anytime with [facility] Nurse Health Advisor 1 or 2   This should be a 45 minute visit.

## 2021-04-09 ENCOUNTER — Telehealth: Payer: Self-pay | Admitting: Family Medicine

## 2021-04-09 NOTE — Telephone Encounter (Signed)
Left message for patient to call back and schedule Medicare Annual Wellness Visit (AWV) either virtually or in office. Left  my Herbie Drape number [phone] ? ? ?AWV-I PER PALMETTO 01/14/17-; please schedule at anytime with [facility] Nurse Health Advisor 1 or 2 ? ? ?This should be a 45 minute visit.  ? ?

## 2021-04-09 NOTE — Telephone Encounter (Signed)
Spoke with patient schedule Medicare Annual Wellness Visit (AWV) either virtually or in office.  ? ?Patient declined stating he will talk to PCP ? ? ?AWV-I PER PALMETTO 01/14/17- ? please schedule at anytime with [facility] Nurse Health Advisor 1 or 2 ?

## 2022-11-15 ENCOUNTER — Ambulatory Visit (INDEPENDENT_AMBULATORY_CARE_PROVIDER_SITE_OTHER): Payer: Medicare Other | Admitting: Family Medicine

## 2022-11-15 ENCOUNTER — Encounter: Payer: Self-pay | Admitting: Family Medicine

## 2022-11-15 VITALS — BP 110/68 | HR 65 | Temp 97.9°F | Ht 70.0 in | Wt 151.1 lb

## 2022-11-15 DIAGNOSIS — C61 Malignant neoplasm of prostate: Secondary | ICD-10-CM

## 2022-11-15 DIAGNOSIS — R109 Unspecified abdominal pain: Secondary | ICD-10-CM | POA: Diagnosis not present

## 2022-11-15 DIAGNOSIS — Z1211 Encounter for screening for malignant neoplasm of colon: Secondary | ICD-10-CM

## 2022-11-15 DIAGNOSIS — E785 Hyperlipidemia, unspecified: Secondary | ICD-10-CM

## 2022-11-15 LAB — LIPID PANEL
Cholesterol: 214 mg/dL — ABNORMAL HIGH (ref 0–200)
HDL: 74.5 mg/dL (ref >39.00)
LDL Cholesterol: 123 mg/dL — ABNORMAL HIGH (ref 0–99)
NonHDL: 139.89
Total CHOL/HDL Ratio: 3
Triglycerides: 85 mg/dL (ref 0.0–149.0)
VLDL: 17 mg/dL (ref 0.0–40.0)

## 2022-11-15 LAB — COMPREHENSIVE METABOLIC PANEL
ALT: 20 U/L (ref 0–53)
AST: 23 U/L (ref 0–37)
Albumin: 4.2 g/dL (ref 3.5–5.2)
Alkaline Phosphatase: 49 U/L (ref 39–117)
BUN: 18 mg/dL (ref 6–23)
CO2: 27 meq/L (ref 19–32)
Calcium: 9 mg/dL (ref 8.4–10.5)
Chloride: 105 meq/L (ref 96–112)
Creatinine, Ser: 0.91 mg/dL (ref 0.40–1.50)
GFR: 84.64 mL/min (ref >60.00)
Glucose, Bld: 99 mg/dL (ref 70–99)
Potassium: 4.2 meq/L (ref 3.5–5.1)
Sodium: 139 meq/L (ref 135–145)
Total Bilirubin: 1.3 mg/dL — ABNORMAL HIGH (ref 0.2–1.2)
Total Protein: 6.8 g/dL (ref 6.0–8.3)

## 2022-11-15 LAB — CBC WITH DIFFERENTIAL/PLATELET
Basophils Absolute: 0 10*3/uL (ref 0.0–0.1)
Basophils Relative: 0.7 % (ref 0.0–3.0)
Eosinophils Absolute: 0 10*3/uL (ref 0.0–0.7)
Eosinophils Relative: 1 % (ref 0.0–5.0)
HCT: 41.8 % (ref 39.0–52.0)
Hemoglobin: 13.9 g/dL (ref 13.0–17.0)
Lymphocytes Relative: 29.7 % (ref 12.0–46.0)
Lymphs Abs: 1.4 10*3/uL (ref 0.7–4.0)
MCHC: 33.2 g/dL (ref 30.0–36.0)
MCV: 99.8 fL (ref 78.0–100.0)
Monocytes Absolute: 0.4 10*3/uL (ref 0.1–1.0)
Monocytes Relative: 8.5 % (ref 3.0–12.0)
Neutro Abs: 2.9 10*3/uL (ref 1.4–7.7)
Neutrophils Relative %: 60.1 % (ref 43.0–77.0)
Platelets: 285 10*3/uL (ref 150.0–400.0)
RBC: 4.19 Mil/uL — ABNORMAL LOW (ref 4.22–5.81)
RDW: 12.9 % (ref 11.5–15.5)
WBC: 4.8 10*3/uL (ref 4.0–10.5)

## 2022-11-15 LAB — POC URINALSYSI DIPSTICK (AUTOMATED)
Bilirubin, UA: NEGATIVE
Blood, UA: NEGATIVE
Glucose, UA: NEGATIVE
Ketones, UA: NEGATIVE
Leukocytes, UA: NEGATIVE
Nitrite, UA: NEGATIVE
Protein, UA: NEGATIVE
Spec Grav, UA: 1.01 (ref 1.010–1.025)
Urobilinogen, UA: 0.2 U/dL
pH, UA: 7.5 (ref 5.0–8.0)

## 2022-11-15 LAB — PSA: PSA: 0.01 ng/mL — ABNORMAL LOW (ref 0.10–4.00)

## 2022-11-15 NOTE — Progress Notes (Signed)
Established Patient Office Visit  Subjective   Patient ID: Chris Stone, male    DOB: Feb 10, 1950  Age: 72 y.o. MRN: [id number]  Chief Complaint  Patient presents with   Nephrolithiasis   Abdominal Pain    Patient complains of abdominal pain, x3 days    Nausea   Back Pain    Patient complains of back pain, x3 days     HPI   Chris Stone is seen today for the following items  Recent episode few days ago where he thought he may have passed a kidney stone.  He states this was Tuesday and he was driving along and had relatively sudden onset of some right flank pain.  This was accompanied by some nausea without vomiting.  About 3 days prior to that that he had noticed some sharp intermittent suprapubic pains.  No burning with urination.  No gross hematuria.  No fever.  His symptoms eventually improved after about 10 minutes.  With location and flank he thought he may have had a kidney stone.  No recurrent symptoms since then.  Last colonoscopy 2009.  He knows he is overdue for colon cancer screening.  He is willing to consider repeat colonoscopy at this time  Past history of prostate cancer.  Requesting follow-up PSA today.  He has history of hyperlipidemia and would like to get follow-up lipids as well. He stays very active and is an avid cyclist.  He has already logged over 6000 miles this year cycling  Past Medical History:  Diagnosis Date   ABDOMINAL MASS 07/21/2008   ALLERGIC RHINITIS 05/09/2008   Basal cell carcinoma 2012?   Upper Lip   Complication of anesthesia    Weakness   Microscopic hematuria 05/09/2008   Prostate cancer ([facility])    Squamous cell carcinoma    nose   Past Surgical History:  Procedure Laterality Date   KNEE ARTHROSCOPY W/ ACL RECONSTRUCTION     LYMPHADENECTOMY Bilateral 06/24/2016   Procedure: PELVIC LYMPHADENECTOMY;  Surgeon: Heloise Purpura, MD;  Location: [facility];  Service: Urology;  Laterality: Bilateral;   PROSTATE BIOPSY     ROBOT ASSISTED LAPAROSCOPIC  RADICAL PROSTATECTOMY N/A 06/24/2016   Procedure: XI ROBOTIC ASSISTED LAPAROSCOPIC RADICAL PROSTATECTOMY LEVEL 2;  Surgeon: Heloise Purpura, MD;  Location: [facility];  Service: Urology;  Laterality: N/A;   TONSILLECTOMY     TONSILLECTOMY      reports that he has never smoked. He has never used smokeless tobacco. He reports current alcohol use. He reports that he does not use drugs. family history includes Cancer (age of onset: 42) in his father; Hypertension in his father; Stroke in his father. Allergies  Allergen Reactions   Penicillins Rash    Has patient had a PCN reaction causing immediate rash, facial/tongue/throat swelling, SOB or lightheadedness with hypotension:unknown Has patient had a PCN reaction causing severe rash involving mucus membranes or skin necrosis:unknown Has patient had a PCN reaction that required hospitalization:NO Has patient had a PCN reaction occurring within the last 10 years:NO CHILDHOOD REACTION; RASH If all of the above answers are "NO", then may proceed with Cephalosporin use.     Review of Systems  Constitutional:  Negative for chills, fever and malaise/fatigue.  Eyes:  Negative for blurred vision.  Respiratory:  Negative for shortness of breath.   Cardiovascular:  Negative for chest pain.  Gastrointestinal:        See HPI  Genitourinary:  Negative for dysuria and hematuria.       See  HPI.  Recent transient right flank pain but none now  Neurological:  Negative for dizziness, weakness and headaches.      Objective:     BP 110/68 (BP Location: Left Arm, Patient Position: Sitting, Cuff Size: Normal)   Pulse 65   Temp 97.9 F (36.6 C) (Oral)   Ht 5\' 10"  (1.778 m)   Wt 151 lb 1.6 oz (68.5 kg)   SpO2 98%   BMI 21.68 kg/m  BP Readings from Last 3 Encounters:  11/15/22 110/68  05/02/20 124/72  06/25/16 (!) 111/59   Wt Readings from Last 3 Encounters:  11/15/22 151 lb 1.6 oz (68.5 kg)  05/02/20 150 lb (68 kg)  06/24/16 148 lb 9.4 oz (67.4 kg)       Physical Exam Vitals reviewed.  Constitutional:      General: He is not in acute distress.    Appearance: He is well-developed. He is not ill-appearing.  Cardiovascular:     Rate and Rhythm: Normal rate and regular rhythm.  Pulmonary:     Effort: Pulmonary effort is normal.     Breath sounds: Normal breath sounds.  Abdominal:     Comments: Normal bowel sounds.  No abdominal distention.  Soft and nontender.  No hepatomegaly or splenomegaly noted.  Neurological:     Mental Status: He is alert.      Results for orders placed or performed in visit on 11/15/22  POCT Urinalysis Dipstick (Automated)  Result Value Ref Range   Color, UA Yellow    Clarity, UA Clear    Glucose, UA Negative Negative   Bilirubin, UA Negative    Ketones, UA Negative    Spec Grav, UA 1.010 1.010 - 1.025   Blood, UA Negative    pH, UA 7.5 5.0 - 8.0   Protein, UA Negative Negative   Urobilinogen, UA 0.2 0.2 or 1.0 E.U./dL   Nitrite, UA Negative    Leukocytes, UA Negative Negative    Last CBC Lab Results  Component Value Date   WBC 4.6 06/17/2016   HGB 12.0 (L) 06/25/2016   HCT 35.1 (L) 06/25/2016   MCV 93.4 06/17/2016   MCH 32.1 06/17/2016   RDW 12.5 06/17/2016   PLT 252 06/17/2016   Last metabolic panel Lab Results  Component Value Date   GLUCOSE 97 06/17/2016   NA 139 06/17/2016   K 4.0 06/17/2016   CL 107 06/17/2016   CO2 26 06/17/2016   BUN 20 06/17/2016   CREATININE 0.84 06/17/2016   GFRNONAA >60 06/17/2016   CALCIUM 8.6 (L) 06/17/2016   PROT 6.8 01/23/2016   ALBUMIN 4.3 01/23/2016   BILITOT 1.8 (H) 01/23/2016   ALKPHOS 55 01/23/2016   AST 25 01/23/2016   ALT 35 01/23/2016   ANIONGAP 6 06/17/2016   Last lipids Lab Results  Component Value Date   CHOL 240 (H) 01/23/2016   HDL 73.40 01/23/2016   LDLCALC 147 (H) 01/23/2016   LDLDIRECT 108.1 08/16/2010   TRIG 97.0 01/23/2016   CHOLHDL 3 01/23/2016      The ASCVD Risk score (Arnett DK, et al., 2019) failed to  calculate for the following reasons:   Cannot find a previous HDL lab   Cannot find a previous total cholesterol lab    Assessment & Plan:   Problem List Items Addressed This Visit       Unprioritized   Prostate cancer ([facility])   Relevant Orders   PSA   Other Visit Diagnoses     Acute right  flank pain    -  Primary   Relevant Orders   POCT Urinalysis Dipstick (Automated) (Completed)   CBC with Differential/Platelet   CMP   Hyperlipidemia, unspecified hyperlipidemia type       Relevant Orders   Lipid panel   CMP   Colon cancer screening       Relevant Orders   Ambulatory referral to Gastroenterology     -Recent acute right flank pain.  Patient had concerns for possible kidney stone.  Urine dipstick today shows no blood or other acute abnormalities.  Observe for now.  If he has recurrence consider CT renal stone study  -Discussed repeat colon cancer screening.  He agrees to setting up repeat colonoscopy.  Last colonoscopy apparently 2009  -Patient requesting follow-up PSA.  Past history of robotic surgery for prostate cancer.  -History of hyperlipidemia.  Repeat lipids today  No follow-ups on file.    Evelena Peat, MD

## 2022-11-28 ENCOUNTER — Encounter: Payer: Self-pay | Admitting: Internal Medicine

## 2023-01-10 ENCOUNTER — Encounter: Payer: Medicare Other | Admitting: Internal Medicine
